# Patient Record
Sex: Male | Born: 1985 | Hispanic: No | Marital: Married | Smoking: Current every day smoker
Health system: Southern US, Community
[De-identification: ages and names within clinical notes are randomized; demographics above are authoritative.]

## PROBLEM LIST (undated history)

## (undated) DIAGNOSIS — J039 Acute tonsillitis, unspecified: Secondary | ICD-10-CM

## (undated) HISTORY — PX: TONSILLECTOMY: SUR1361

---

## 2014-11-24 ENCOUNTER — Ambulatory Visit (INDEPENDENT_AMBULATORY_CARE_PROVIDER_SITE_OTHER): Payer: Medicaid Other | Admitting: Family Medicine

## 2014-11-24 ENCOUNTER — Encounter: Payer: Self-pay | Admitting: Family Medicine

## 2014-11-24 VITALS — BP 105/75 | HR 94 | Temp 98.4°F | Ht 69.5 in | Wt 154.2 lb

## 2014-11-24 DIAGNOSIS — Z603 Acculturation difficulty: Secondary | ICD-10-CM | POA: Diagnosis not present

## 2014-11-24 DIAGNOSIS — Z0289 Encounter for other administrative examinations: Secondary | ICD-10-CM

## 2014-11-24 DIAGNOSIS — Z720 Tobacco use: Secondary | ICD-10-CM

## 2014-11-24 DIAGNOSIS — Z008 Encounter for other general examination: Secondary | ICD-10-CM

## 2014-11-27 DIAGNOSIS — Z720 Tobacco use: Secondary | ICD-10-CM | POA: Insufficient documentation

## 2014-11-27 DIAGNOSIS — Z0289 Encounter for other administrative examinations: Secondary | ICD-10-CM | POA: Insufficient documentation

## 2014-11-27 DIAGNOSIS — Z603 Acculturation difficulty: Secondary | ICD-10-CM | POA: Insufficient documentation

## 2014-11-27 DIAGNOSIS — Z0000 Encounter for general adult medical examination without abnormal findings: Secondary | ICD-10-CM | POA: Insufficient documentation

## 2014-11-27 NOTE — Progress Notes (Signed)
Syrian in-person interpreter utilized during today's visit.  [facility] New Patient Visit  HPI:  Patient presents to [facility] today for a new patient appointment to establish general primary care.  .  ROS: See HPI  Immigrant Social History: - Date arrived in [place]: October 24 2014 - Country of origin: [place] - Location of refugee camp (if applicable), how long there, and what caused patient to leave home country?: [place] - Primary language: Arabic  -Requires intepreter (essentially speaks no [place]) - Education: Highest level of education:  - Tobacco/alcohol/drug use: daily smoking, ~1 ppd - Marriage Status: married, wife here in [place]. - Sexual activity: vaginal w/ wife - Were you beaten or tortured in your country or refugee camp?  denies  - if yes:  Are you having bad dreams about your experience?     Do you feel "jumpy" or "nervous?"     Do you feel that the experience is happening again?     Are you "super alert" or watchful?   Preventative Care History: -Seen at health department?: no -- upcoming appointment -  Past Medical Hx:  -denies  Past Surgical Hx:  -denies  PHYSICAL EXAM: BP 105/75 mmHg  Pulse 94  Temp(Src) 98.4 F (36.9 C) (Oral)  Ht 5' 9.5" (1.765 m)  Wt 154 lb 3.2 oz (69.945 kg)  BMI 22.45 kg/m2 Gen: Well NAD HEENT: EOMI,  MMM Lungs: CTABL Nl WOB Heart: RRR no MRG Abd: NABS, NT, ND Exts: Non edematous BL  LE, warm and well perfused.  Neuro:  No focal deficits Psych:  Not depressed or anxious appearing.  Linear and coherent thought process as evidenced by speech pattern. Smiles spontaneously.

## 2014-11-27 NOTE — Assessment & Plan Note (Signed)
Will need to address next visit.

## 2014-11-27 NOTE — Assessment & Plan Note (Signed)
Negative exam today.  Will need to obtain refugee labs from [facility] dept.

## 2014-11-27 NOTE — Assessment & Plan Note (Signed)
Speaks Arabic.  Needs interpreter.

## 2014-12-04 ENCOUNTER — Encounter: Payer: Self-pay | Admitting: Family Medicine

## 2014-12-04 ENCOUNTER — Ambulatory Visit (INDEPENDENT_AMBULATORY_CARE_PROVIDER_SITE_OTHER): Payer: Medicaid Other | Admitting: Family Medicine

## 2014-12-04 VITALS — BP 111/77 | HR 117 | Temp 100.8°F | Wt 153.0 lb

## 2014-12-04 DIAGNOSIS — J039 Acute tonsillitis, unspecified: Secondary | ICD-10-CM | POA: Diagnosis present

## 2014-12-04 MED ORDER — PENICILLIN V POTASSIUM 250 MG PO TABS: 250.0000 mg | ORAL_TABLET | Freq: Four times a day (QID) | ORAL | Status: AC

## 2014-12-04 MED ORDER — IBUPROFEN 600 MG PO TABS
600.0000 mg | ORAL_TABLET | Freq: Three times a day (TID) | ORAL | Status: DC | PRN
Start: 2014-12-04 — End: 2014-12-04

## 2014-12-04 MED ORDER — PENICILLIN V POTASSIUM 250 MG PO TABS: 250.0000 mg | ORAL_TABLET | Freq: Four times a day (QID) | ORAL | Status: DC

## 2014-12-04 MED ORDER — DIPHENHYDRAMINE HCL 25 MG PO TABS: 25.0000 mg | ORAL_TABLET | Freq: Four times a day (QID) | ORAL | Status: DC | PRN

## 2014-12-04 MED ORDER — IBUPROFEN 600 MG PO TABS
600.0000 mg | ORAL_TABLET | Freq: Three times a day (TID) | ORAL | Status: DC | PRN
Start: 2014-12-04 — End: 2015-06-18

## 2014-12-04 MED ORDER — DIPHENHYDRAMINE HCL 25 MG PO TABS
25.0000 mg | ORAL_TABLET | Freq: Four times a day (QID) | ORAL | Status: DC | PRN
Start: 2014-12-04 — End: 2014-12-04

## 2014-12-04 NOTE — Patient Instructions (Signed)

## 2014-12-07 NOTE — Progress Notes (Signed)
Patient ID: Jerome Parker, male   DOB: 1985-12-30, 29 y.o.   MRN: [id number]   [facility] Yolande Jollyaleb G Kely Dohn, MD Phone: [phone]  Subjective:   # Throat Pain - Pt. Presents for SDA for throat pain. He has been having symptoms that started about 4 days ago with some slight nasal discharge / post nasal drip. However, he says that his tonsils have been infected before. He says that his symptoms have gotten rapidly worse to the point that he is experiencing total body chills / tactile fevers. He has total body aches. He has tried ibuprofen with little relief. He is around small children but no known sick contacts.   SORE THROAT  Sore throat began 4 days ago. Pain is: 10/10 Severity: Pt. Reports severe pain mostly with coughing. He has had this before with tonsilitis Medications tried: Ibuprofen, Augmentin (given to him by a physician in [place]) Strep throat exposure: None that he is aware of.  STD exposure: None that he is aware of.   Symptoms Fever: Tactile fevers at home and 100.8 here Cough: Ongoing dry cough Runny nose: Yes, with post nasal drip while laying down.  Muscle aches: Yes Swollen Glands: None that he reports.  Trouble breathing: No  Drooling: No Weight loss: No weight loss.   Patient believes could be caused by: Another episode of tonsillitis. He says that he gets recurrent episodes and has had them prior to coming to the [place] in [place].   Review of Symptoms - see HPI PMH - Smoking status noted.      All relevant systems were reviewed and were negative unless otherwise noted in the HPI  Past Medical History Reviewed problem list.  Medications- reviewed and updated Current Outpatient Prescriptions  Medication Sig Dispense Refill  . diphenhydrAMINE (BENADRYL) 25 MG tablet Take 1 tablet (25 mg total) by mouth every 6 (six) hours as needed. 30 tablet 0  . ibuprofen (ADVIL,MOTRIN) 600 MG tablet Take 1 tablet (600 mg total) by mouth  every 8 (eight) hours as needed. 30 tablet 2  . penicillin v potassium (VEETID) 250 MG tablet Take 1 tablet (250 mg total) by mouth 4 (four) times daily. 40 tablet 0   No current facility-administered medications for this visit.   Chief complaint-noted No additions to family history Social history- patient is a non  smoker  Objective: BP 111/77 mmHg  Pulse 117  Temp(Src) 100.8 F (38.2 C) (Oral)  Wt 153 lb (69.4 kg) Gen: NAD, alert, cooperative with exam HEENT: NCAT, EOMI, PERRL, TMs nml without effusions / erythema, Nares patent without discharge, Throat diffusely erythematous (bright red) with exudates and pustules noted predominantly on left tonsilar area. Pt. Able to fully open mouth.  Neck: FROM, supple. Submandibular LAD palpable.  CV: RRR, good S1/S2, no murmur Resp: CTABL, no wheezes, non-labored Abd: SNTND, BS present, no guarding or organomegaly Ext: No edema, warm, normal tone, moves UE/LE spontaneously Neuro: Alert and oriented, No gross deficits Skin: no rashes no lesions  Assessment/Plan:  # Tonsilitis  - pt. With history of recurrent tonsillitis. No known sick contacts or exposure to CMV, EBV vs. Bacterial etiology.  - Symptomatic treatment with Ibuprofen for pain - Discussed hydration, drinking plenty of water.  - Penicillin V K prescribed for Strep.  - Return precautions given.  - If this is a recurrent problem for this patient, may need to consider surgical intervention. I expect that his symptoms will improve with the above course for  Now.

## 2014-12-31 ENCOUNTER — Encounter (HOSPITAL_COMMUNITY): Payer: Self-pay | Admitting: *Deleted

## 2014-12-31 ENCOUNTER — Emergency Department (INDEPENDENT_AMBULATORY_CARE_PROVIDER_SITE_OTHER)
Admission: EM | Admit: 2014-12-31 | Discharge: 2014-12-31 | Disposition: A | Discharge status: Home / Self Care | Payer: Medicaid Other | Source: Home / Self Care | Attending: Emergency Medicine | Admitting: Emergency Medicine

## 2014-12-31 DIAGNOSIS — Z603 Acculturation difficulty: Secondary | ICD-10-CM | POA: Diagnosis not present

## 2014-12-31 DIAGNOSIS — J312 Chronic pharyngitis: Secondary | ICD-10-CM | POA: Diagnosis not present

## 2014-12-31 HISTORY — DX: Acute tonsillitis, unspecified: J03.90

## 2014-12-31 MED ORDER — CLINDAMYCIN HCL 150 MG PO CAPS: 150.0000 mg | ORAL_CAPSULE | Freq: Three times a day (TID) | ORAL | Status: DC

## 2014-12-31 MED ORDER — CETIRIZINE HCL 10 MG PO TABS: 10.0000 mg | ORAL_TABLET | Freq: Every day | ORAL | Status: DC

## 2014-12-31 MED ORDER — OMEPRAZOLE 20 MG PO CPDR: 20.0000 mg | DELAYED_RELEASE_CAPSULE | Freq: Every day | ORAL | Status: DC

## 2014-12-31 MED ORDER — LIDOCAINE VISCOUS 2 % MT SOLN: 5.0000 mL | OROMUCOSAL | Status: DC | PRN

## 2014-12-31 NOTE — Discharge Instructions (Signed)
I have given you several medicines to help with her sore throat. There is an antibiotic, clindamycin, that you take 3 times a day for 1 week. Take Zyrtec 1 pill daily in case of any nasal drainage. Take omeprazole 1 pill daily in case stomach acid is irritating your tonsils overnight. Use the viscous lidocaine every 4 hours as needed for mouth pain. I have put in a referral to the ear nose and throat doctor to discuss tonsil removal.  You may need to see Dr. Gwendolyn Grant before you can see the ear nose and throat doctor. Follow-up as needed.

## 2014-12-31 NOTE — ED Provider Notes (Signed)
CSN: [id number]     Arrival date & time 12/31/14  1314 History   First MD Initiated Contact with Patient 12/31/14 1325     Chief Complaint  Patient presents with  . Sore Throat   (Consider location/radiation/quality/duration/timing/severity/associated sxs/prior Treatment) HPI  He is a 29 year old man here for evaluation of sore throat. He is a recent immigrant via the refugee system. He is [place] and Arabic only speaking. A phone interpreter was used during this visit.  He states he has had a sore throat since he came to [place], approximately 2 months ago. He was seen at his PCPs office for this 1 month ago and given a course of penicillin. He states this did not help his sore throat. He reports subjective fevers. No nasal discharge, nasal congestion, cough, chest pain, shortness of breath. No nausea or vomiting. He states the sore throat is worse first thing in the morning. He states he had a similar episode a year or so ago when he was at the refugee camp in [place]. He also reports intermittent episodes of mouth ulcers. They are quite painful when they occur, but heal spontaneously in about one week. He would like to see a doctor about getting his tonsils removed.  Past Medical History  Diagnosis Date  . Tonsillitis    History reviewed. No pertinent past surgical history. No family history on file. History  Substance Use Topics  . Smoking status: Current Every Day Smoker  . Smokeless tobacco: Not on file  . Alcohol Use: Not on file    Review of Systems As in history of present illness Allergies  Review of patient's allergies indicates no known allergies.  Home Medications   Prior to Admission medications   Medication Sig Start Date End Date Taking? Authorizing Provider  ibuprofen (ADVIL,MOTRIN) 600 MG tablet Take 1 tablet (600 mg total) by mouth every 8 (eight) hours as needed. 12/04/14  Yes Yolande Jolly, MD  cetirizine (ZYRTEC) 10 MG tablet Take 1 tablet (10 mg total) by  mouth daily. 12/31/14   Charm Rings, MD  clindamycin (CLEOCIN) 150 MG capsule Take 1 capsule (150 mg total) by mouth 3 (three) times daily. 12/31/14   Charm Rings, MD  lidocaine (XYLOCAINE) 2 % solution Use as directed 5 mLs in the mouth or throat every 4 (four) hours as needed for mouth pain. Swish for 15 seconds, then spit. 12/31/14   Charm Rings, MD  omeprazole (PRILOSEC) 20 MG capsule Take 1 capsule (20 mg total) by mouth daily. 12/31/14   Charm Rings, MD   BP 95/56 mmHg  Pulse 68  Temp(Src) 99 F (37.2 C) (Oral)  Resp 16  SpO2 99% Physical Exam  Constitutional: He is oriented to person, place, and time. He appears well-developed and well-nourished. No distress.  HENT:  Right Ear: Tympanic membrane normal.  Left Ear: Tympanic membrane normal.  Nose: Nose normal.  Mouth/Throat: No oropharyngeal exudate.  He has erythematous and cryptic tonsils. No exudate. Mild posterior pharyngeal erythema without cobblestoning.  Eyes: Conjunctivae are normal.  Neck: Neck supple.  Cardiovascular: Normal rate and regular rhythm.   No murmur heard. Pulmonary/Chest: Effort normal and breath sounds normal. No respiratory distress. He has no wheezes. He has no rales.  Lymphadenopathy:    He has no cervical adenopathy.  Neurological: He is alert and oriented to person, place, and time.    ED Course  Procedures (including critical care time) Labs Review Labs Reviewed - No data to  display  Imaging Review No results found.   MDM   1. Sore throat, chronic    We'll treat with a course of clindamycin to cover atypical bacteria. I suspect his sore throat is more a consequence of postnasal drainage or possibly reflux. We'll treat with Zyrtec and omeprazole as well as the antibiotics. I provided a prescription for viscous lidocaine to use as needed for mouth ulcers. I put in a referral to ENT to discuss tonsillectomy. Discussed with the patient that he may need to see his primary care physician  before Medicaid will allow this referral. Follow-up with PCP as needed.    Charm Rings, MD 12/31/14 1430

## 2014-12-31 NOTE — ED Notes (Signed)
[facility]ia [facility] [phone]: c/o sore throat x several weeks; completed 9 days of 10-day course of PCN VK starting 5/16 - states no improvement with abx.  Feels like when he's had tonsillitis in past.  Has felt feverish.  Denies cough, runny nose, or congestion.  C/O bilat earache.  Has been taking 600mg  IBU.

## 2015-01-21 ENCOUNTER — Emergency Department (INDEPENDENT_AMBULATORY_CARE_PROVIDER_SITE_OTHER)
Admission: EM | Admit: 2015-01-21 | Discharge: 2015-01-21 | Disposition: A | Discharge status: Home / Self Care | Payer: Medicaid Other | Source: Home / Self Care

## 2015-01-21 ENCOUNTER — Encounter (HOSPITAL_COMMUNITY): Payer: Self-pay | Admitting: *Deleted

## 2015-01-21 DIAGNOSIS — R5383 Other fatigue: Secondary | ICD-10-CM | POA: Diagnosis not present

## 2015-01-21 DIAGNOSIS — R52 Pain, unspecified: Secondary | ICD-10-CM | POA: Diagnosis not present

## 2015-01-21 DIAGNOSIS — R6883 Chills (without fever): Secondary | ICD-10-CM

## 2015-01-21 DIAGNOSIS — J029 Acute pharyngitis, unspecified: Secondary | ICD-10-CM | POA: Diagnosis not present

## 2015-01-21 LAB — COMPREHENSIVE METABOLIC PANEL
ALT: 12 U/L — ABNORMAL LOW (ref 17–63)
ANION GAP: 9 (ref 5–15)
AST: 16 U/L (ref 15–41)
Albumin: 4.4 g/dL (ref 3.5–5.0)
Alkaline Phosphatase: 59 U/L (ref 38–126)
BILIRUBIN TOTAL: 0.6 mg/dL (ref 0.3–1.2)
BUN: 10 mg/dL (ref 6–20)
CHLORIDE: 101 mmol/L (ref 101–111)
CO2: 26 mmol/L (ref 22–32)
Calcium: 9.1 mg/dL (ref 8.9–10.3)
Creatinine, Ser: 0.87 mg/dL (ref 0.61–1.24)
GFR calc Af Amer: >60 mL/min (ref >60)
GFR calc non Af Amer: >60 mL/min (ref >60)
GLUCOSE: 97 mg/dL (ref 65–99)
Potassium: 3.6 mmol/L (ref 3.5–5.1)
Sodium: 136 mmol/L (ref 135–145)
Total Protein: 7.5 g/dL (ref 6.5–8.1)

## 2015-01-21 LAB — POCT INFECTIOUS MONO SCREEN: Mono Screen: NEGATIVE

## 2015-01-21 LAB — CBC
HCT: 39.4 % (ref 39.0–52.0)
Hemoglobin: 13.2 g/dL (ref 13.0–17.0)
MCH: 29.4 pg (ref 26.0–34.0)
MCHC: 33.5 g/dL (ref 30.0–36.0)
MCV: 87.8 fL (ref 78.0–100.0)
Platelets: 252 10*3/uL (ref 150–400)
RBC: 4.49 MIL/uL (ref 4.22–5.81)
RDW: 14.7 % (ref 11.5–15.5)
WBC: 13.1 10*3/uL — AB (ref 4.0–10.5)

## 2015-01-21 LAB — POCT RAPID STREP A: Streptococcus, Group A Screen (Direct): NEGATIVE

## 2015-01-21 MED ORDER — AZITHROMYCIN 250 MG PO TABS: ORAL_TABLET | ORAL | Status: DC

## 2015-01-21 MED ORDER — CEFTRIAXONE SODIUM 1 G IJ SOLR
INTRAMUSCULAR | Status: AC
  Filled 2015-01-21: qty 10

## 2015-01-21 MED ORDER — CEFTRIAXONE SODIUM 1 G IJ SOLR
1.0000 g | Freq: Once | INTRAMUSCULAR | Status: AC
  Administered 2015-01-21: 1 g via INTRAMUSCULAR

## 2015-01-21 NOTE — Discharge Instructions (Signed)
Your strep swab was negative.  We sent the swab for culture which should take a few days. We are checking you for some other things today and these labs will be back in a few days.  We gave you an antibiotic shot today. We are also sending in an oral antibiotic, please take 2 pills tomorrow then one pill a day for the next 4 days.  Please take prilosec daily for possible reflux.  You should follow up with Dr Gwendolyn GrantWalden for further work up if all these tests are negative. One cause of this could be a cyclical fever syndrome.

## 2015-01-21 NOTE — ED Notes (Addendum)
Pt reports     sorethroat  reoccuring  Was  Seen   ucc   3   Weeks  Ago  For chronic  Throat  Infections   Pt  Has  Been  Feeling  Weak  Feverish  And  Tired

## 2015-01-21 NOTE — ED Provider Notes (Signed)
CSN: [id number]     Arrival date & time 01/21/15  1428 History   None    Chief Complaint  Patient presents with  . Sore Throat    HPI  28 yom [place] from [place] sore throat.   Interview conducted through phone interpretor.   Was seen at our clinic 3 wks for st. Started on clinda for possible atypical coverage. Pt instructed to start prilosec for possible lpr and zyrtec for congestion/post nasal drip. Instructed to f/u with pcp.   Today he states st resolved for couple wks after seeing [place] but returned one wk ago. He finished clinda but never took prilosec/zyrtec.   Today he states severely st past 4-5 days. Hurts to swallow. Subjective fevers. Fatigue. Chills. Generalized body aches past few days. He states this st has been recurrent for past year. He generally gets every month for approx one week. This has been occuring since he entered the [place] from [place].   Denies head congestion, nasal congestion, itchy/watery eyes, reflux sx. Denies trouble handling secretions, drooling, voice change.   Past Medical History  Diagnosis Date  . Tonsillitis    History reviewed. No pertinent past surgical history. History reviewed. No pertinent family history. History  Substance Use Topics  . Smoking status: Current Every Day Smoker  . Smokeless tobacco: Not on file  . Alcohol Use: Not on file    Review of Systems See HPI.  Allergies  Review of patient's allergies indicates no known allergies.  Home Medications   Prior to Admission medications   Medication Sig Start Date End Date Taking? Authorizing Provider  azithromycin (ZITHROMAX) 250 MG tablet Take 2 tabs PO x 1 dose, then 1 tab PO QD x 4 days 01/21/15   Raelyn Ensign, PA  cetirizine (ZYRTEC) 10 MG tablet Take 1 tablet (10 mg total) by mouth daily. 12/31/14   Charm Rings, MD  clindamycin (CLEOCIN) 150 MG capsule Take 1 capsule (150 mg total) by mouth 3 (three) times daily. 12/31/14   Charm Rings, MD  ibuprofen  (ADVIL,MOTRIN) 600 MG tablet Take 1 tablet (600 mg total) by mouth every 8 (eight) hours as needed. 12/04/14   Hillery Hunter Melancon, MD  lidocaine (XYLOCAINE) 2 % solution Use as directed 5 mLs in the mouth or throat every 4 (four) hours as needed for mouth pain. Swish for 15 seconds, then spit. 12/31/14   Charm Rings, MD  omeprazole (PRILOSEC) 20 MG capsule Take 1 capsule (20 mg total) by mouth daily. 12/31/14   Charm Rings, MD   BP 108/68 mmHg  Pulse 80  Temp(Src) 99.1 F (37.3 C) (Oral)  Resp 18  SpO2 100% Physical Exam  Constitutional: He is oriented to person, place, and time. He appears well-developed and well-nourished.  Non-toxic appearance. He does not have a sickly appearance. He does not appear ill. No distress.  HENT:  Right Ear: Tympanic membrane normal.  Left Ear: Tympanic membrane normal.  Nose: No mucosal edema or rhinorrhea. Right sinus exhibits no maxillary sinus tenderness and no frontal sinus tenderness. Left sinus exhibits no maxillary sinus tenderness and no frontal sinus tenderness.  Mouth/Throat: Uvula is midline and mucous membranes are normal. No dental abscesses or uvula swelling. Oropharyngeal exudate and posterior oropharyngeal erythema present. No posterior oropharyngeal edema or tonsillar abscesses.  Erythematous posterior pharynx with exudates on bilateral tonsils. No tonsillar enlargement. No uvular deviation. No "hot potato" voice.   Neck: No Brudzinski's sign noted.  Pulmonary/Chest: Effort  normal and breath sounds normal. No tachypnea.  Lymphadenopathy:       Head (right side): No submental, no submandibular and no tonsillar adenopathy present.       Head (left side): No submental, no submandibular and no tonsillar adenopathy present.    He has no cervical adenopathy.  Neurological: He is alert and oriented to person, place, and time.   ED Course  Procedures (including critical care time) Labs Review  Strep swab negative.   Imaging Review No results  found.   MDM   1. Sore throat   2. Body aches   3. Other fatigue   4. Chills    Recurrent sore throats. Pain/abnormal exam has returned despite finishing clindamycin course last week. Strep sawb neg today. Throat culture sent. Checking for mono, hiv, cbc, cmp today. Rocephin today due to abnormal exam and recurrent pain. Start azithro. Reiterated starting prilosec for possible lpr. F/U with Dr Gwendolyn GrantWalden. Cyclical fever syndrome possible etiology. Pt expressed understanding to f/u with Dr Gwendolyn GrantWalden through interpretor.     Raelyn Ensignodd Deyton Ellenbecker, [place] 01/22/15 2140

## 2015-01-21 NOTE — ED Notes (Signed)
[employer]  Interpretors  Used   

## 2015-01-22 LAB — HIV ANTIBODY (ROUTINE TESTING W REFLEX): HIV SCREEN 4TH GENERATION: NONREACTIVE

## 2015-01-22 NOTE — ED Notes (Signed)
Final reports all negative, no further action required

## 2015-01-24 ENCOUNTER — Ambulatory Visit: Payer: Medicaid Other | Admitting: Family Medicine

## 2015-01-24 LAB — CULTURE, GROUP A STREP: Strep A Culture: NEGATIVE

## 2015-01-26 ENCOUNTER — Encounter: Payer: Self-pay | Admitting: Internal Medicine

## 2015-01-26 ENCOUNTER — Ambulatory Visit (INDEPENDENT_AMBULATORY_CARE_PROVIDER_SITE_OTHER): Payer: Medicaid Other | Admitting: Internal Medicine

## 2015-01-26 VITALS — BP 92/72 | HR 85 | Temp 97.6°F | Ht 69.5 in | Wt 144.3 lb

## 2015-01-26 DIAGNOSIS — J3501 Chronic tonsillitis: Secondary | ICD-10-CM | POA: Diagnosis present

## 2015-01-26 DIAGNOSIS — J029 Acute pharyngitis, unspecified: Secondary | ICD-10-CM | POA: Insufficient documentation

## 2015-01-26 MED ORDER — FLUTICASONE PROPIONATE 50 MCG/ACT NA SUSP
2.0000 | Freq: Every day | NASAL | Status: DC
Start: 2015-01-26 — End: 2017-01-19

## 2015-01-26 NOTE — Patient Instructions (Signed)
I have put in the referral to ENT.   Continue using zyrtec and omeprazole.   Fill Rx for flonase.   Nice to meet you today!

## 2015-01-26 NOTE — Assessment & Plan Note (Signed)
Has had recent negative mono, strep, and HIV tests. Recently completed course of azithromycin. Due to recurrent sore throats and abnormalities to his tonsils, am referring to an ENT. Have given Rx for flonase and encouraged pt to continue taking zyrtec to treat for possible allergic rhinitis and irritation.

## 2015-01-26 NOTE — Progress Notes (Addendum)
   Subjective:    Patient ID: Jerome Parker, male    DOB: Sep 17, 1985, 29 y.o.   MRN: [id number]  HPI Chronic throat pain for over a year. Pain occurs 3-4x per month and lasts for a few days each time. Associated symptoms include body aches, fever, ear pain, and rhinorrhea. Also reports chronic mouth ulcers. Patient has finished course of azithromycin that he received at recent urgent care visit.    Review of Systems See HPI. All of ROS are negative.     Objective:   Physical Exam  Constitutional: He appears well-developed and well-nourished.  HENT:  Right Ear: There is tenderness. No drainage. Tympanic membrane is not erythematous.  Left Ear: There is tenderness. No drainage. Tympanic membrane is not erythematous.  Nose: Rhinorrhea present. No mucosal edema.  Mouth/Throat: Oral lesions present. Normal dentition. Oropharyngeal exudate and posterior oropharyngeal erythema present.          Assessment & Plan:  29 year old male presenting with chronic sore throat.  If mouth ulcers do not improve with time and possible tonsillectomy, investigate other possible causes.

## 2015-04-24 ENCOUNTER — Encounter (HOSPITAL_COMMUNITY): Payer: Self-pay | Admitting: Emergency Medicine

## 2015-04-24 ENCOUNTER — Emergency Department (HOSPITAL_COMMUNITY)
Admission: EM | Admit: 2015-04-24 | Discharge: 2015-04-24 | Disposition: A | Discharge status: Home / Self Care | Payer: Medicaid Other | Source: Home / Self Care | Attending: Family Medicine | Admitting: Family Medicine

## 2015-04-24 DIAGNOSIS — J029 Acute pharyngitis, unspecified: Secondary | ICD-10-CM

## 2015-04-24 MED ORDER — AMOXICILLIN 250 MG PO CAPS: 250.0000 mg | ORAL_CAPSULE | Freq: Three times a day (TID) | ORAL | Status: DC

## 2015-04-24 NOTE — ED Provider Notes (Signed)
CSN: [id number]     Arrival date & time 04/24/15  1346 History   First MD Initiated Contact with Patient 04/24/15 1558     Chief Complaint  Patient presents with  . Sore Throat   (Consider location/radiation/quality/duration/timing/severity/associated sxs/prior Treatment) Patient is a 29 y.o. male presenting with pharyngitis. The history is provided by the patient. A language interpreter was used (Arabic interpreter telephone).  Sore Throat Pertinent negatives include no chest pain, no abdominal pain and no shortness of breath.  Arabic language interpreter used.  Patient presents to [facility] for complaint of recurrent acute pharyngitis that recurred in the past 2-3 days, accompanied by chills yesterday. No fevers documented.  Has been seen [facility] in [facility] as well as [facility] for this. Primary doctor is in [facility].  Patient reports he has seen ENT and was told that if six recurrences in one year, will be recommended for tonsillectomy.   Rates the pain 10/10. Denies cough or ear pain, no rhinorrhea. No N/V, does get some GERD symptoms with spicy foods.  No diarrhoea or dysuria.   Past Medical History  Diagnosis Date  . Tonsillitis    History reviewed. No pertinent past surgical history. No family history on file. Social History  Substance Use Topics  . Smoking status: Current Every Day Smoker  . Smokeless tobacco: None  . Alcohol Use: No    Review of Systems  Constitutional: Positive for chills. Negative for fever and fatigue.  HENT: Positive for sore throat. Negative for congestion, ear discharge, ear pain, postnasal drip, rhinorrhea and sinus pressure.   Respiratory: Negative for cough, chest tightness, shortness of breath and wheezing.   Cardiovascular: Negative for chest pain.  Gastrointestinal: Negative for nausea, abdominal pain, diarrhea and constipation.    Allergies  Review of patient's allergies indicates no known allergies.  Home Medications   Prior to Admission medications    Medication Sig Start Date End Date Taking? Authorizing Provider  amoxicillin (AMOXIL) 250 MG capsule Take 1 capsule (250 mg total) by mouth 3 (three) times daily. 04/24/15   Barbaraann Barthel, MD  azithromycin (ZITHROMAX) 250 MG tablet Take 2 tabs PO x 1 dose, then 1 tab PO QD x 4 days Patient not taking: Reported on 04/24/2015 01/21/15   Raelyn Ensign, PA  cetirizine (ZYRTEC) 10 MG tablet Take 1 tablet (10 mg total) by mouth daily. 12/31/14   Charm Rings, MD  clindamycin (CLEOCIN) 150 MG capsule Take 1 capsule (150 mg total) by mouth 3 (three) times daily. Patient not taking: Reported on 04/24/2015 12/31/14   Charm Rings, MD  fluticasone [facility]) 50 MCG/ACT nasal spray Place 2 sprays into both nostrils daily. 01/26/15   Arvilla Market, DO  ibuprofen (ADVIL,MOTRIN) 600 MG tablet Take 1 tablet (600 mg total) by mouth every 8 (eight) hours as needed. 12/04/14   Hillery Hunter Melancon, MD  lidocaine (XYLOCAINE) 2 % solution Use as directed 5 mLs in the mouth or throat every 4 (four) hours as needed for mouth pain. Swish for 15 seconds, then spit. 12/31/14   Charm Rings, MD  omeprazole (PRILOSEC) 20 MG capsule Take 1 capsule (20 mg total) by mouth daily. 12/31/14   Charm Rings, MD   Meds Ordered and Administered this Visit  Medications - No data to display  BP 98/51 mmHg  Pulse 68  Temp(Src) 98.5 F (36.9 C) (Oral)  Resp 14  SpO2 99% No data found.   Physical Exam  Constitutional: He appears well-developed and well-nourished.  No distress.  HENT:  Head: Normocephalic and atraumatic.  Erythema of oropharynx; scant tonsillar exudates.  Moist mucus membranes. Shotty anterior cervical adenopathy. Neck supple, no limitations to ROM actively.   Pulmonary/Chest: He has no wheezes.  Skin: He is not diaphoretic.    ED Course  Procedures (including critical care time)  Labs Review Labs Reviewed - No data to display  Imaging Review No results found.   Visual Acuity Review  Right Eye Distance:    Left Eye Distance:   Bilateral Distance:    Right Eye Near:   Left Eye Near:    Bilateral Near:         MDM   1. Acute pharyngitis, unspecified etiology        Barbaraann Barthel, MD 04/24/15 [phone]

## 2015-04-24 NOTE — Discharge Instructions (Signed)
It was a pleasure to see you today.   For your recurrent throat infection, I am prescribing AMOXICILLIN  capsules, take 1 capsule by mouth three times a day for 10 days.   Please make an appointment with your primary doctor, Dr. Gwendolyn Grant, in the coming 2 weeks.

## 2015-04-24 NOTE — ED Notes (Signed)
Sore throat and has been evaluated for previous throat illness at pcp and by ent.  Denies cough.  Current sore throat is worse than last sore throat.  Intermittent chills

## 2015-05-15 ENCOUNTER — Ambulatory Visit: Payer: Medicaid Other | Admitting: Family Medicine

## 2015-06-18 ENCOUNTER — Emergency Department (HOSPITAL_COMMUNITY)
Admission: EM | Admit: 2015-06-18 | Discharge: 2015-06-18 | Disposition: A | Discharge status: Home / Self Care | Payer: Medicaid Other | Attending: Emergency Medicine | Admitting: Emergency Medicine

## 2015-06-18 DIAGNOSIS — J020 Streptococcal pharyngitis: Secondary | ICD-10-CM | POA: Diagnosis not present

## 2015-06-18 DIAGNOSIS — F17200 Nicotine dependence, unspecified, uncomplicated: Secondary | ICD-10-CM | POA: Insufficient documentation

## 2015-06-18 DIAGNOSIS — J029 Acute pharyngitis, unspecified: Secondary | ICD-10-CM | POA: Diagnosis present

## 2015-06-18 DIAGNOSIS — Z79899 Other long term (current) drug therapy: Secondary | ICD-10-CM | POA: Insufficient documentation

## 2015-06-18 LAB — RAPID STREP SCREEN (MED CTR MEBANE ONLY): STREPTOCOCCUS, GROUP A SCREEN (DIRECT): POSITIVE — AB

## 2015-06-18 MED ORDER — OXYCODONE-ACETAMINOPHEN 5-325 MG PO TABS
1.0000 | ORAL_TABLET | Freq: Once | ORAL | Status: AC
  Administered 2015-06-18: 1 via ORAL
  Filled 2015-06-18: qty 1

## 2015-06-18 MED ORDER — PENICILLIN G BENZATHINE 1200000 UNIT/2ML IM SUSP
1.2000 10*6.[IU] | Freq: Once | INTRAMUSCULAR | Status: AC
  Administered 2015-06-18: 1.2 10*6.[IU] via INTRAMUSCULAR
  Filled 2015-06-18: qty 2

## 2015-06-18 MED ORDER — HYDROCODONE-ACETAMINOPHEN 5-325 MG PO TABS: 1.0000 | ORAL_TABLET | Freq: Four times a day (QID) | ORAL | Status: DC | PRN

## 2015-06-18 MED ORDER — DEXAMETHASONE SODIUM PHOSPHATE 10 MG/ML IJ SOLN
10.0000 mg | Freq: Once | INTRAMUSCULAR | Status: AC
  Administered 2015-06-18: 10 mg via INTRAMUSCULAR
  Filled 2015-06-18: qty 1

## 2015-06-18 NOTE — ED Provider Notes (Signed)
CSN: [id number]     Arrival date & time 06/18/15  1948 History  By signing my name below, I, Jarvis Morganaylor Ferguson, attest that this documentation has been prepared under the direction and in the presence of Kerrie BuffaloHope Georjean Toya, NP Electronically Signed: Jarvis Morganaylor Ferguson, ED Scribe. 06/19/2015. 2:55 AM.   No chief complaint on file.   The history is provided by the patient and the spouse. A language interpreter was used (Arabic).    HPI Comments: Jerome Parker is a 29 y.o. male who presents to the Emergency Department complaining of intermittent, moderate, sore throat onset 3 years. Pt states he typically gets a sore throat several times per month. He endorses associated subjective fever and difficulty swallowing. Pt reports he has seen a doctor in [place] and has followed up with 2 physicians [place] the [place]. He states Dr. Pollyann Kennedyosen told him he needs to have surgery but they are wanting to wait 6 months to see if he keeps getting recurrent strep infections; it has currently been 4 months so far and his next appt is 2 days from now. Pt notes that Dr. Pollyann Kennedyosen gave him acylovir. He would like to be referred to a new ENT and someone who would do surgery immediately. He states the pain is exacerbated with swallowing. He denies any alleviating factors. Pt took Ibuprofen prior to arrival with no significant relief. He is a current every day smoker. Pt denies any other associated symptoms at this time.    Past Medical History  Diagnosis Date  . Tonsillitis    No past surgical history on file. No family history on file. Social History  Substance Use Topics  . Smoking status: Current Every Day Smoker  . Smokeless tobacco: Not on file  . Alcohol Use: No    Review of Systems  Constitutional: Positive for fever.  HENT: Positive for sore throat.        Pain with swallowing  All other systems reviewed and are negative.     Allergies  Review of patient's allergies indicates no known allergies.  Home Medications    Prior to Admission medications   Medication Sig Start Date End Date Taking? Authorizing Provider  ibuprofen (ADVIL,MOTRIN) 800 MG tablet Take 800 mg by mouth every 8 (eight) hours as needed for moderate pain.   Yes Historical Provider, MD  pseudoephedrine (SUDAFED) 120 MG 12 hr tablet Take 120 mg by mouth 2 (two) times daily.   Yes Historical Provider, MD  fluticasone (FLONASE) 50 MCG/ACT nasal spray Place 2 sprays into both nostrils daily. Patient not taking: Reported on 06/18/2015 01/26/15   Arvilla Marketatherine Lauren Wallace, DO  HYDROcodone-acetaminophen [facility](NORCO) 5-325 MG tablet Take 1 tablet by mouth every 6 (six) hours as needed. 06/18/15   Adriane Gabbert Orlene OchM Kennedee Kitzmiller, NP  lidocaine (XYLOCAINE) 2 % solution Use as directed 5 mLs in the mouth or throat every 4 (four) hours as needed for mouth pain. Swish for 15 seconds, then spit. Patient not taking: Reported on 06/18/2015 12/31/14   Charm RingsErin J Honig, MD  omeprazole (PRILOSEC) 20 MG capsule Take 1 capsule (20 mg total) by mouth daily. Patient not taking: Reported on 06/18/2015 12/31/14   Charm RingsErin J Honig, MD   Triage Vitals: BP 125/70 mmHg  Pulse 110  Temp(Src) 98.2 F (36.8 C)  Resp 20  SpO2 100%  Physical Exam  Constitutional: He is oriented to person, place, and time. He appears well-developed and well-nourished. No distress.  HENT:  Head: Normocephalic and atraumatic.  Mouth/Throat: Uvula is midline. No  trismus in the jaw. Oropharyngeal exudate and posterior oropharyngeal erythema (left) present. No posterior oropharyngeal edema or tonsillar abscesses.  Eyes: EOM are normal.  Neck: Normal range of motion. Neck supple.  Cardiovascular: Normal rate and regular rhythm.   Pulmonary/Chest: Effort normal. He has no wheezes.  Musculoskeletal: Normal range of motion. He exhibits no edema.  Lymphadenopathy:    He has cervical adenopathy (right).  Neurological: He is alert and oriented to person, place, and time. No cranial nerve deficit.  Skin: Skin is warm and dry.   Psychiatric: He has a normal mood and affect.  Nursing note and vitals reviewed.   ED Course  Procedures (including critical care time)  DIAGNOSTIC STUDIES: Oxygen Saturation is 100% on RA, normal by my interpretation.    COORDINATION OF CARE:  8:56 PM- Will order rapid strep screen. Pt advised of plan for treatment and pt agrees.    Labs Review Labs Reviewed  RAPID STREP SCREEN (NOT AT [facility]) - Abnormal; Notable for the following:    Streptococcus, Group A Screen (Direct) POSITIVE (*)    All other components within normal limits    Imaging Review No results found. I have personally reviewed and evaluated these lab results as part of my medical decision-making.   MDM  29 y.o. male with sore throat and positive strep screen stable for d/c without difficulty swallowing and does not appear toxic. Treated with Bicillin 1.2 MU IM. Discussed with the patient clinical and lab findings and plan of care and all questioned fully answered. He will follow up with ENT.    Final diagnoses:  Strep throat   I personally performed the services described in this documentation, which was scribed in my presence. The recorded information has been reviewed and is accurate.    [address] [place], NP 06/19/15 1610  Leta Baptist, MD 06/19/15 [id number]

## 2015-06-18 NOTE — Discharge Instructions (Signed)
Strep Throat °Strep throat is an infection of the throat. It is caused by germs. Strep throat spreads from person to person because of coughing, sneezing, or close contact. °HOME CARE °Medicines  °· Take over-the-counter and prescription medicines only as told by your doctor. °· Take your antibiotic medicine as told by your doctor. Do not stop taking the medicine even if you feel better. °· Have family members who also have a sore throat or fever go to a doctor. °Eating and Drinking  °· Do not share food, drinking cups, or personal items. °· Try eating soft foods until your sore throat feels better. °· Drink enough fluid to keep your pee (urine) clear or pale yellow. °General Instructions °· Rinse your mouth (gargle) with a salt-water mixture 3-4 times per day or as needed. To make a salt-water mixture, stir ½-1 tsp of salt into 1 cup of warm water. °· Make sure that all people in your house wash their hands well. °· Rest. °· Stay home from school or work until you have been taking antibiotics for 24 hours. °· Keep all follow-up visits as told by your doctor. This is important. °GET HELP IF: °· Your neck keeps getting bigger. °· You get a rash, cough, or earache. °· You cough up thick liquid that is green, yellow-brown, or bloody. °· You have pain that does not get better with medicine. °· Your problems get worse instead of getting better. °· You have a fever. °GET HELP RIGHT AWAY IF: °· You throw up (vomit). °· You get a very bad headache. °· You neck hurts or it feels stiff. °· You have chest pain or you are short of breath. °· You have drooling, very bad throat pain, or changes in your voice. °· Your neck is swollen or the skin gets red and tender. °· Your mouth is dry or you are peeing less than normal. °· You keep feeling more tired or it is hard to wake up. °· Your joints are red or they hurt. °  °This information is not intended to replace advice given to you by your health care provider. Make sure you  discuss any questions you have with your health care provider. °  °Document Released: 12/24/2007 Document Revised: 03/28/2015 Document Reviewed: 10/30/2014 °Elsevier Interactive Patient Education ©2016 [employer] Inc. ° °

## 2015-08-21 ENCOUNTER — Encounter: Payer: Self-pay | Admitting: Family Medicine

## 2015-08-21 ENCOUNTER — Ambulatory Visit: Payer: Medicaid Other | Admitting: Family Medicine

## 2015-08-21 ENCOUNTER — Ambulatory Visit (INDEPENDENT_AMBULATORY_CARE_PROVIDER_SITE_OTHER): Payer: Medicaid Other | Admitting: Family Medicine

## 2015-08-21 VITALS — BP 119/76 | HR 69 | Temp 98.1°F | Wt 161.2 lb

## 2015-08-21 DIAGNOSIS — H5713 Ocular pain, bilateral: Secondary | ICD-10-CM

## 2015-08-21 DIAGNOSIS — H547 Unspecified visual loss: Secondary | ICD-10-CM

## 2015-08-21 DIAGNOSIS — R51 Headache: Secondary | ICD-10-CM | POA: Diagnosis present

## 2015-08-21 DIAGNOSIS — R519 Headache, unspecified: Secondary | ICD-10-CM

## 2015-08-21 MED ORDER — KETOROLAC TROMETHAMINE 60 MG/2ML IM SOLN
60.0000 mg | Freq: Once | INTRAMUSCULAR | Status: AC
  Administered 2015-08-21: 60 mg via INTRAMUSCULAR

## 2015-08-21 NOTE — Patient Instructions (Signed)
I think you have a headache or a migraine. Your exam today was normal. If you continue to have issues with pain please let us know. If you start having severe pain, nausea, vomiting, weakness, numbness, or changes in vision, please let us know.  Take care,  Dr Jimmey Ralph

## 2015-08-21 NOTE — Progress Notes (Signed)
    Subjective:  Jerome Parker is a 30 y.o. male who presents to the [facility] today for same day appointment with a chief complaint of eye pain. Language line was used.   HPI:  Eye Pain Started 4-5 days ago. Pain located above eyes and into forehead. Pain is intermittent in nature and improved with rest. Pain lasts for 3-4 minutes happens 3-4 hours per day. Bright lights make the pain worse. Has not tried any medications. Feels like vision is "whiter." No fevers or chills. No history of migraines or headache. No sick contact. No rhinorrhea, cough, or sneeze. No weakness or numbness. No increased pain with neck flexion or cough. Not worse with movement.   ROS: Per HPI  Objective:  Physical Exam: BP 119/76 mmHg  Pulse 69  Temp(Src) 98.1 F (36.7 C) (Oral)  Wt 161 lb 3.2 oz (73.12 kg)  Gen: NAD, resting comfortably HEENT: PERRL. EOMI. No papilledema noted.  CV: RRR with no murmurs appreciated Pulm: NWOB, CTAB with no crackles, wheezes, or rhonchi MSK: no edema, cyanosis, or clubbing noted Skin: warm, dry Neuro: CN2-12 intact. Strength 5/5 throughout. Sensation to light touch intact throughout.  Psych: Normal affect and thought content  Vision Screening: R: 20/50 L: 20/40 B: 20/40  Assessment/Plan:  Headache No red flag signs or symptoms. Neurological exam intact. Likely new onset tension type headache. Pain improved in office with  of IM toradol. Pain likely related to eye strain from decreased visual acuity.  Decreased Visual Acuity Vision screening results above. Will refer to optometry.   Katina Degree. Jimmey Ralph, MD [facility] Resident PGY-2 08/21/2015 2:49 PM

## 2016-11-20 ENCOUNTER — Ambulatory Visit (INDEPENDENT_AMBULATORY_CARE_PROVIDER_SITE_OTHER): Payer: Medicaid Other | Admitting: Family Medicine

## 2016-11-20 ENCOUNTER — Encounter: Payer: Self-pay | Admitting: Family Medicine

## 2016-11-20 VITALS — BP 110/74 | HR 63 | Temp 97.9°F | Ht 69.5 in | Wt 170.4 lb

## 2016-11-20 DIAGNOSIS — B002 Herpesviral gingivostomatitis and pharyngotonsillitis: Secondary | ICD-10-CM

## 2016-11-20 DIAGNOSIS — J029 Acute pharyngitis, unspecified: Secondary | ICD-10-CM | POA: Diagnosis not present

## 2016-11-20 LAB — POCT RAPID STREP A (OFFICE): Rapid Strep A Screen: NEGATIVE

## 2016-11-20 MED ORDER — VALACYCLOVIR HCL 1 G PO TABS
1000.0000 mg | ORAL_TABLET | Freq: Two times a day (BID) | ORAL | 0 refills | Status: DC
Start: 2016-11-20 — End: 2021-05-16

## 2016-11-20 NOTE — Patient Instructions (Addendum)
Sore throat suspected to be secondary to viral infection (Herpes simplex). Valacyclovir sent to pharmacy to take twice daily for 10 days. Referral to ENT placed.  Dr. Caroleen Hammanumley

## 2016-11-23 NOTE — Progress Notes (Signed)
Subjective:     Patient ID: Jerome Parker, male   DOB: 06-Aug-1985, 31 y.o.   MRN: [id number]  HPI Jerome Parker is a 31yo male presenting today with oral lesions. Visit conducted with aid of Arabic interpretor.  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 Received tonsillectomy in 2016.  Today presenting with history of sore throat 10 days ago which resolved, however now with oral ulcers for the last several days. Similar to ulcers he has had in the past with prior flares of herpes. Presented to an Urgent Care facility and treated with [facility] for possible strep throat, however reports they did not swab him. Ulcers are painful and make it difficult for him to eat, however denies decreased oral intake. When asked about fever, he reports he "always" has fevers for the last several years. Previously followed by Dr. Pollyann Kennedyosen with ENT and requests referral back to their office. Patient insists on cure for herpes multiple times throughout encounter and becomes angry when told there is no cure--discussed that we can treat flares and try to decrease how often they occur, but patient insists on curing herpes all together. Smoker.  Review of Systems Per HPI    Objective:   Physical Exam  Constitutional: He appears well-developed and well-nourished. No distress.  HENT:  Head: Normocephalic and atraumatic.  Mouth/Throat: No oropharyngeal exudate.  History of Tonsillectomy. Three oral ulcers noted along right side of mouth, pale in appearance.   Cardiovascular: Normal rate and regular rhythm.   No murmur heard. Pulmonary/Chest: Effort normal. No respiratory distress. He has no wheezes.  Lymphadenopathy:    He has no cervical adenopathy.  Psychiatric:  Angry throughout encounter      Assessment and Plan:     1. Herpes Pharyngitis Consistent with prior episodes per patient. Did not swab for HSV today given prior diagnosis. Patient refuses Acyclovir  despite prior success and tolerance, wanting to try something different. Prescription for Valacyclovir sent to pharmacy. Referral to ENT placed, previously followed by Dr. Pollyann Kennedyosen. Patient is at increased risk for oral cancer given history of smoking. Note patient was angry throughout encounter due to no cure for HSV and unable to guarantee this will not recur again. Patient requested information on HSV, however refused patient handout in Arabic when nurse attempted to give it to him.

## 2017-01-10 ENCOUNTER — Emergency Department (HOSPITAL_COMMUNITY)
Admission: EM | Admit: 2017-01-10 | Discharge: 2017-01-10 | Discharge status: Against Medical Advice | Payer: Medicaid Other | Attending: Emergency Medicine | Admitting: Emergency Medicine

## 2017-01-10 ENCOUNTER — Encounter (HOSPITAL_COMMUNITY): Payer: Self-pay

## 2017-01-10 DIAGNOSIS — R1084 Generalized abdominal pain: Secondary | ICD-10-CM | POA: Diagnosis not present

## 2017-01-10 DIAGNOSIS — F17200 Nicotine dependence, unspecified, uncomplicated: Secondary | ICD-10-CM | POA: Diagnosis not present

## 2017-01-10 DIAGNOSIS — Z79899 Other long term (current) drug therapy: Secondary | ICD-10-CM | POA: Diagnosis not present

## 2017-01-10 DIAGNOSIS — R1012 Left upper quadrant pain: Secondary | ICD-10-CM | POA: Diagnosis present

## 2017-01-10 LAB — CBC
HEMATOCRIT: 41.6 % (ref 39.0–52.0)
HEMOGLOBIN: 14 g/dL (ref 13.0–17.0)
MCH: 29.9 pg (ref 26.0–34.0)
MCHC: 33.7 g/dL (ref 30.0–36.0)
MCV: 88.9 fL (ref 78.0–100.0)
Platelets: 255 10*3/uL (ref 150–400)
RBC: 4.68 MIL/uL (ref 4.22–5.81)
RDW: 14.1 % (ref 11.5–15.5)
WBC: 12.4 10*3/uL — ABNORMAL HIGH (ref 4.0–10.5)

## 2017-01-10 LAB — URINALYSIS, ROUTINE W REFLEX MICROSCOPIC
BACTERIA UA: NONE SEEN
Bilirubin Urine: NEGATIVE
Glucose, UA: NEGATIVE mg/dL
KETONES UR: NEGATIVE mg/dL
Leukocytes, UA: NEGATIVE
Nitrite: NEGATIVE
PROTEIN: NEGATIVE mg/dL
Specific Gravity, Urine: 1.025 (ref 1.005–1.030)
Squamous Epithelial / LPF: NONE SEEN
pH: 5 (ref 5.0–8.0)

## 2017-01-10 LAB — COMPREHENSIVE METABOLIC PANEL
ALK PHOS: 56 U/L (ref 38–126)
ALT: 13 U/L — ABNORMAL LOW (ref 17–63)
AST: 17 U/L (ref 15–41)
Albumin: 4.2 g/dL (ref 3.5–5.0)
Anion gap: 8 (ref 5–15)
BUN: 15 mg/dL (ref 6–20)
CALCIUM: 9.4 mg/dL (ref 8.9–10.3)
CHLORIDE: 100 mmol/L — AB (ref 101–111)
CO2: 28 mmol/L (ref 22–32)
Creatinine, Ser: 0.99 mg/dL (ref 0.61–1.24)
GFR calc Af Amer: >60 mL/min (ref >60)
GFR calc non Af Amer: >60 mL/min (ref >60)
GLUCOSE: 125 mg/dL — AB (ref 65–99)
POTASSIUM: 4.2 mmol/L (ref 3.5–5.1)
SODIUM: 136 mmol/L (ref 135–145)
Total Bilirubin: 0.7 mg/dL (ref 0.3–1.2)
Total Protein: 7.6 g/dL (ref 6.5–8.1)

## 2017-01-10 LAB — LIPASE, BLOOD: Lipase: 29 U/L (ref 11–51)

## 2017-01-10 NOTE — ED Notes (Signed)
Previous nurse in to see patient. States I'm leaving.  Encouraged to stay. Physician notified.

## 2017-01-10 NOTE — ED Triage Notes (Signed)
Pt complaining of abdominal pain x 1 week. Pt denies any N/V/D. Pt denies any urinary symptoms. Pt also complaining of sore throat, cough and headache. Pt denies any fevers/chills at home.

## 2017-01-10 NOTE — ED Provider Notes (Signed)
[facility]-EMERGENCY DEPT Provider Note   CSN: [id number] Arrival date & time: 01/10/17  1927     History   Chief Complaint Chief Complaint  Patient presents with  . Abdominal Pain  . Sore Throat    HPI Jerome Parker is a 31 y.o. male.  The history is provided by the patient.  Abdominal Pain   This is a new problem. The current episode started more than 2 days ago. The problem occurs constantly. The problem has not changed since onset.The pain is associated with eating. The pain is located in the LUQ and RUQ. The quality of the pain is aching. The pain is at a severity of 8/10. The pain is severe. Pertinent negatives include fever, diarrhea, nausea, vomiting, dysuria, hematuria and arthralgias. Nothing aggravates the symptoms. Nothing relieves the symptoms. Past workup does not include CT scan.    Past Medical History:  Diagnosis Date  . Tonsillitis     Patient Active Problem List   Diagnosis Date Noted  . Sore throat 01/26/2015  . Refugee health examination 11/27/2014  . Tobacco abuse 11/27/2014  . Immigrant with language difficulty 11/27/2014    History reviewed. No pertinent surgical history.     Home Medications    Prior to Admission medications   Medication Sig Start Date End Date Taking? Authorizing Provider  fluticasone (FLONASE) 50 MCG/ACT nasal spray Place 2 sprays into both nostrils daily. Patient not taking: Reported on 06/18/2015 01/26/15   Arvilla MarketWallace, Catherine Lauren, DO  HYDROcodone-acetaminophen [facility](NORCO) 5-325 MG tablet Take 1 tablet by mouth every 6 (six) hours as needed. Patient not taking: Reported on 08/21/2015 06/18/15   [name], Hope M, NP  ibuprofen (ADVIL,MOTRIN) 800 MG tablet Take 800 mg by mouth every 8 (eight) hours as needed for moderate pain. Reported on 08/21/2015    [provider]  lidocaine (XYLOCAINE) 2 % solution Use as directed 5 mLs in the mouth or throat every 4 (four) hours as needed for mouth pain. Swish for 15 seconds, then  spit. Patient not taking: Reported on 06/18/2015 12/31/14   Charm RingsHonig, Erin J, MD  omeprazole (PRILOSEC) 20 MG capsule Take 1 capsule (20 mg total) by mouth daily. Patient not taking: Reported on 06/18/2015 12/31/14   Charm RingsHonig, Erin J, MD  pseudoephedrine (SUDAFED) 120 MG 12 hr tablet Take 120 mg by mouth 2 (two) times daily. Reported on 08/21/2015    [provider]  valACYclovir (VALTREX) 1000 MG tablet Take 1 tablet (1,000 mg total) by mouth 2 (two) times daily. 11/20/16   Araceli Boucheumley, [name], DO    Family History History reviewed. No pertinent family history.  Social History Social History  Substance Use Topics  . Smoking status: Current Every Day Smoker  . Smokeless tobacco: Never Used  . Alcohol use No     Allergies   Patient has no known allergies.   Review of Systems Review of Systems  Constitutional: Negative for chills and fever.  HENT: Negative for ear pain and sore throat.   Eyes: Negative for pain and visual disturbance.  Respiratory: Negative for cough and shortness of breath.   Cardiovascular: Negative for chest pain and palpitations.  Gastrointestinal: Positive for abdominal pain. Negative for blood in stool, diarrhea, nausea and vomiting.  Genitourinary: Negative for dysuria and hematuria.  Musculoskeletal: Negative for arthralgias and back pain.  Skin: Negative for color change and rash.  Neurological: Negative for seizures and syncope.  All other systems reviewed and are negative.    Physical Exam Updated Vital Signs  BP 116/77   Pulse (!) 119   Temp 98.3 F (36.8 C) (Oral)   Resp 16   Wt 73.5 kg (162 lb 1 oz)   SpO2 98%   BMI 23.59 kg/m   Physical Exam  Constitutional: He appears well-developed and well-nourished. No distress.  HENT:  Head: Normocephalic and atraumatic.  Eyes: Conjunctivae are normal.  Neck: Neck supple.  Cardiovascular: Normal rate and regular rhythm.   No murmur heard. Pulmonary/Chest: Effort normal and breath sounds normal.  No respiratory distress.  Abdominal: Soft. He exhibits no distension and no mass. There is tenderness (tender in R middle abdomen and L middle abdomen). No hernia.  Musculoskeletal: He exhibits no edema.  Neurological: He is alert. No cranial nerve deficit. Coordination normal.  5/5 motor strength and intact sensation in all extremities. Intact bilateral finger-to-nose coordination  Skin: Skin is warm and dry.  Nursing note and vitals reviewed.    ED Treatments / Results  Labs (all labs ordered are listed, but only abnormal results are displayed) Labs Reviewed  COMPREHENSIVE METABOLIC PANEL - Abnormal; Notable for the following:       Result Value   Chloride 100 (*)    Glucose, Bld 125 (*)    ALT 13 (*)    All other components within normal limits  CBC - Abnormal; Notable for the following:    WBC 12.4 (*)    All other components within normal limits  URINALYSIS, ROUTINE W REFLEX MICROSCOPIC - Abnormal; Notable for the following:    Hgb urine dipstick SMALL (*)    All other components within normal limits  LIPASE, BLOOD    EKG  EKG Interpretation None       Radiology No results found.  Procedures Procedures (including critical care time)  Medications Ordered in ED Medications - No data to display   Initial Impression / Assessment and Plan / ED Course  I have reviewed the triage vital signs and the nursing notes.  Pertinent labs & imaging results that were available during my care of the patient were reviewed by me and considered in my medical decision making (see chart for details).     31 year old Arabic speaking male previously healthy who presents with 1 week of constant bilateral abdominal pain.  He reports pain began 6 days ago after a large Middle Eastern meal, which he has had before.  He recently participated in Ramadan with a long periods of fasting.  He has not previously had abdominal pain associated with Ramadan fasting.  Denies any vomiting, nausea,  or diarrhea.  He denies any abdominal trauma.  He takes ibuprofen once per week.  No recent fevers.  He has had no previous abdominal surgeries.  Denies testicular pain or dysuria.  He requests Flagyl without imaging, as this is what he has been prescribed previously in [place].  Afebrile, VSS.  Lungs good auscultation bilaterally.  Abdomen exquisitely tender in bilateral lower quadrants.  CBC showing WBC 12.4, Hgb 14.  CMP unremarkable.  Lipase 29.  UA without evidence of UTI.  Given concerning abdominal exam, unclear etiology.  Feel CT is warranted for further evaluation.  Patient unwilling to undergo CT and requests Flagyl prescription.  Unable to provide antibiotics without underlying diagnosis.  Patient contacting significant other regarding CT imaging.  On reassessment, patient noted to have eloped.  Pt care d/w Dr. Fredderick Phenix  Final Clinical Impressions(s) / ED Diagnoses   Final diagnoses:  Generalized abdominal pain    New Prescriptions Discharge Medication List  as of 01/10/2017 11:44 PM       Makyle Eslick, Homero Fellers, MD 01/11/17 [id number]    Rolan Bucco, MD 01/11/17 1353

## 2017-01-11 ENCOUNTER — Emergency Department (HOSPITAL_COMMUNITY): Payer: Medicaid Other

## 2017-01-11 ENCOUNTER — Encounter (HOSPITAL_COMMUNITY): Payer: Self-pay | Admitting: *Deleted

## 2017-01-11 ENCOUNTER — Emergency Department (HOSPITAL_COMMUNITY)
Admission: EM | Admit: 2017-01-11 | Discharge: 2017-01-11 | Disposition: A | Discharge status: Home / Self Care | Payer: Medicaid Other | Attending: Emergency Medicine | Admitting: Emergency Medicine

## 2017-01-11 DIAGNOSIS — R109 Unspecified abdominal pain: Secondary | ICD-10-CM | POA: Diagnosis present

## 2017-01-11 DIAGNOSIS — F17200 Nicotine dependence, unspecified, uncomplicated: Secondary | ICD-10-CM | POA: Diagnosis not present

## 2017-01-11 DIAGNOSIS — R1084 Generalized abdominal pain: Secondary | ICD-10-CM

## 2017-01-11 MED ORDER — IOPAMIDOL (ISOVUE-300) INJECTION 61%
INTRAVENOUS | Status: AC
  Administered 2017-01-11: 100 mL via INTRAVENOUS
  Filled 2017-01-11: qty 100

## 2017-01-11 MED ORDER — TRAMADOL HCL 50 MG PO TABS: 50.0000 mg | ORAL_TABLET | Freq: Four times a day (QID) | ORAL | 0 refills | Status: DC | PRN

## 2017-01-11 MED ORDER — PREDNISONE 20 MG PO TABS: 40.0000 mg | ORAL_TABLET | Freq: Every day | ORAL | 0 refills | Status: DC

## 2017-01-11 NOTE — Discharge Instructions (Signed)
Please be sure to follow-up with your physician, and with our specialist.  Take all medication as directed, and return here for concerning changes in your condition.

## 2017-01-11 NOTE — ED Provider Notes (Signed)
[facility]EMER[facility]ENCY DEPT Provider Note   CSN: [id number] Arrival date & time: 01/11/17  1128  By signing my name below, I, Jerome Parker, attest that this documentation has been prepared under the direction and in the presence of Jerome Munch, MD. Electronically Signed: Thelma Parker, Scribe. 01/11/17. 1:36 PM.  History   Chief Complaint Chief Complaint  Patient presents with  . Abdominal Pain   The history is provided by the patient. No language interpreter was used.    HPI Comments: Jerome Parker is a 31 y.o. male who presents to the Emergency Department complaining of waxing/waning, gradually worsening bilateral abdominal pain for 1 week. He notes after Ramadan ended, he started eating normally, when the pain began. He has taken tylenol with mild relief. He denies vomiting and diarrhea. Pt has no pertinent medical histories. He denies drinking alcohol. He denies any new exercise or change in activity. He was seen in the ED for this yesterday.   Past Medical History:  Diagnosis Date  . Tonsillitis     Patient Active Problem List   Diagnosis Date Noted  . Sore throat 01/26/2015  . Refugee health examination 11/27/2014  . Tobacco abuse 11/27/2014  . Immigrant with language difficulty 11/27/2014    Past Surgical History:  Procedure Laterality Date  . TONSILLECTOMY         Home Medications    Prior to Admission medications   Medication Sig Start Date End Date Taking? Authorizing Provider  fluticasone (FLONASE) 50 MCG/ACT nasal spray Place 2 sprays into both nostrils daily. Patient not taking: Reported on 06/18/2015 01/26/15   Arvilla Market, DO  HYDROcodone-acetaminophen [facility]) 5-325 MG tablet Take 1 tablet by mouth every 6 (six) hours as needed. Patient not taking: Reported on 08/21/2015 06/18/15   Janne Napoleon, NP  ibuprofen (ADVIL,MOTRIN) 800 MG tablet Take 800 mg by mouth every 8 (eight) hours as needed for moderate pain. Reported on 08/21/2015    [provider]  lidocaine (XYLOCAINE) 2 % solution Use as directed 5 mLs in the mouth or throat every 4 (four) hours as needed for mouth pain. Swish for 15 seconds, then spit. Patient not taking: Reported on 06/18/2015 12/31/14   Charm Rings, MD  omeprazole (PRILOSEC) 20 MG capsule Take 1 capsule (20 mg total) by mouth daily. Patient not taking: Reported on 06/18/2015 12/31/14   Charm Rings, MD  pseudoephedrine (SUDAFED) 120 MG 12 hr tablet Take 120 mg by mouth 2 (two) times daily. Reported on 08/21/2015    [provider]  valACYclovir (VALTREX) 1000 MG tablet Take 1 tablet (1,000 mg total) by mouth 2 (two) times daily. 11/20/16   Araceli Bouche, DO    Family History No family history on file.  Social History Social History  Substance Use Topics  . Smoking status: Current Every Day Smoker  . Smokeless tobacco: Never Used  . Alcohol use No     Allergies   Patient has no known allergies.   Review of Systems Review of Systems  Constitutional: Negative for fever.  Respiratory: Negative for shortness of breath.   Cardiovascular: Negative for chest pain.  Gastrointestinal: Positive for abdominal pain. Negative for diarrhea and vomiting.  Musculoskeletal:       Negative aside from HPI  Skin:       Negative aside from HPI  Allergic/Immunologic: Negative for immunocompromised state.  Neurological: Negative for weakness.     Physical Exam Updated Vital Signs BP 108/78 (BP Location: Right Arm)  Pulse 96   Temp 98.2 F (36.8 C) (Oral)   Resp 18   Ht 5' 8.9" (1.75 m)   Wt 163 lb (73.9 kg)   SpO2 99%   BMI 24.14 kg/m   Physical Exam  Constitutional: He is oriented to person, place, and time. He appears well-developed and well-nourished.  HENT:  Head: Normocephalic and atraumatic.  Cardiovascular: Normal rate.   Pulmonary/Chest: Effort normal.  Abdominal:  RLQ, LLQ TTP  Neurological: He is alert and oriented to person, place, and time.  Skin: Skin is warm  and dry.  Psychiatric: He has a normal mood and affect.  Nursing note and vitals reviewed.    ED Treatments / Results  DIAGNOSTIC STUDIES: Oxygen Saturation is 99% on RA, normal by my interpretation.    COORDINATION OF CARE: 1:36 PM Discussed treatment plan with pt at bedside and pt agreed to plan.   Radiology Ct Abdomen Pelvis W Contrast  Result Date: 01/11/2017 CLINICAL DATA:  Periumbilical pain for 1 week. EXAM: CT ABDOMEN AND PELVIS WITH CONTRAST TECHNIQUE: Multidetector CT imaging of the abdomen and pelvis was performed using the standard protocol following bolus administration of intravenous contrast. CONTRAST:  100mL ISOVUE-300 IOPAMIDOL (ISOVUE-300) INJECTION 61% COMPARISON:  None. FINDINGS: Lower chest: Lung bases are clear. Hepatobiliary: No focal hepatic lesion. No biliary duct dilatation. Gallbladder is normal. Common bile duct is normal. Pancreas: Pancreas is normal. No ductal dilatation. No pancreatic inflammation. Spleen: Normal spleen Adrenals/urinary tract: Adrenal glands and kidneys are normal. The ureters and bladder normal. Stomach/Bowel: Stomach and small bowel are normal. There is some thickening of the mucosa at the terminal ileum (image 42, series 6. There is mild thickening of the bowel wall distal ileum over proximally 6 cm (image 42, series 6. Appendix appears normal (image 53, series 3 The ascending, transverse, and descending colon are normal. Vascular/Lymphatic: Abdominal aorta is normal caliber. There is no retroperitoneal or periportal lymphadenopathy. No pelvic lymphadenopathy. Reproductive: Prostate normal for age Other: No free fluid. Musculoskeletal: No aggressive osseous lesion. IMPRESSION: 1. Subtle bowel wall thickening of the distal ileum and mucosal thickening at the ileocecal valve is nonspecific but could indicate inflammatory bowel disease (Crohn's disease). Terminal ileitis could have a similar pattern. 2. Appendix appears normal. 3. No renal obstruction.  Electronically Signed   By: Genevive BiStewart  Edmunds M.D.   On: 01/11/2017 15:28    Procedures Procedures (including critical care time)  Medications Ordered in ED Medications  iopamidol (ISOVUE-300) 61 % injection (100 mLs Intravenous Contrast Given 01/11/17 1457)   3:59 PM On repeat exam the patient is awake and alert, smiling, sitting upright, watching soccer. I discussed all findings with patient and his wife, we discussed the importance of following up with gastroenterology, primary care. We discussed the possibility of his recent dietary changes as contributed to his illness. Patient denies history of inflammatory bowel disease. Patient was started on a course of medication, discharged with close outpatient follow-up. With concern for inflammation, but no evidence for acute infection, including acute appendicitis, and after initiation of medication, outpatient follow-up.   Initial Impression / Assessment and Plan / ED Course  I have reviewed the triage vital signs and the nursing notes.  Pertinent labs & imaging results that were available during my care of the patient were reviewed by me and considered in my medical decision making (see chart for details).  Abdominal pain  Final Clinical Impressions(s) / ED Diagnoses   I personally performed the services described in this documentation, which was  scribed in my presence. The recorded information has been reviewed and is accurate.       Jerome Munch, MD 01/11/17 [phone]

## 2017-01-11 NOTE — ED Triage Notes (Signed)
PT was seen here last night for abdominal pain.  Refused CT at the time and was discharged.  Stated today that he would like to have the CT.

## 2017-01-19 ENCOUNTER — Encounter: Payer: Self-pay | Admitting: Student

## 2017-01-19 ENCOUNTER — Ambulatory Visit (INDEPENDENT_AMBULATORY_CARE_PROVIDER_SITE_OTHER): Payer: Medicaid Other | Admitting: Student

## 2017-01-19 VITALS — BP 110/62 | HR 89 | Temp 97.6°F | Ht 69.0 in | Wt 161.4 lb

## 2017-01-19 DIAGNOSIS — R1030 Lower abdominal pain, unspecified: Secondary | ICD-10-CM | POA: Diagnosis present

## 2017-01-19 MED ORDER — POLYETHYLENE GLYCOL 3350 17 GM/SCOOP PO POWD: 17.0000 g | Freq: Two times a day (BID) | ORAL | 1 refills | Status: DC | PRN

## 2017-01-19 NOTE — Patient Instructions (Signed)
It was great seeing you today! We have addressed the following issues today 1. Abdominal pain: We have done some blood tests today. If the blood test is concerning for Crohn's disease, I will send referral to gastroenterologist. Otherwise, we will discuss the plan. I also recommend trying MiraLAX for possible constipation.  If we did any lab work today, and the results require attention, either me or my nurse will get in touch with you. If everything is normal, you will get a letter in mail and a message via . If you don't hear from us in two weeks, please give us a call. Otherwise, we look forward to seeing you again at your next visit. If you have any questions or concerns before then, please call the clinic at [phone].  Please bring all your medications to every doctors visit  Sign up for My Chart to have easy access to your labs results, and communication with your Primary care physician.    Please check-out at the front desk before leaving the clinic.    Take Care,   Dr. Alanda SlimGonfa

## 2017-01-19 NOTE — Assessment & Plan Note (Signed)
Unclear about the etiology for his abdominal pain. His CT abdomen with subtle bowel wall thickening of the distal ileum and mucosal thickening of the ileocecal valve which is nonspecific but could be indication for Crohn's disease or terminal ileitis. Patient without diarrhea or blood in stool. I also wonder if this is due to GI flora change as a result of fasting. He has no constitutional symptoms or family history. He is actually gaining weight. Will obtain CBC with differential, ESR and CRP. Will also try MiraLAX. If ESR or CRP is elevated, will order referral to GI for further evaluation.

## 2017-01-19 NOTE — Progress Notes (Signed)
Subjective:    Jerome Parker is a 31 y.o. old male here for ED follow-up on abdominal pain  HPI Abdominal pain: for 10 days. Pain is over his lower abdomen bilaterally. Pain is intermittent. He was unable to characterize the pain. No association with meal. He was fasting for the month of Ramadan for a month prior. He went to ED about 8 days ago. He had a CT abdomen that showed subtle bowel wall thickening of the distal ileum and mucosal thickening of the ileocecal valve which is nonspecific but could be indication for Crohn's disease or terminal ileitis. He was discharged home on prednisone 40 mg daily and tramadol. With that his pain improved. However, he says his pain started again after he stopped prednisone. Urinalysis, CMP, lipase and CBC were negative except for leukocytosis to 12.6.  Patient denies nausea, vomiting, diarrhea, fever, hematochezia and melena. Reports bowel movement every 2-3 days. 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 Denies perianal lesion or skin lesion. Denies recent illness or new food. Denies family history of colon cancer or IBD. Denies drinking alcohol. Admits smoking 2-3 cigarettes a day. He is an immigrant from [place]. He is a Dealer and works on plane.   PMH/Problem List: has Refugee health examination; Tobacco abuse; Immigrant with language difficulty; Sore throat; and Lower abdominal pain on his problem list.   has a past medical history of Tonsillitis.  FH:  No family history on file.  [place] Social History  Substance Use Topics  . Smoking status: Current Every Day Smoker  . Smokeless tobacco: Never Used  . Alcohol use No    Review of Systems Review of systems negative except for pertinent positives and negatives in history of present illness above.     Objective:     Vitals:   01/19/17 1611  BP: 110/62  Pulse: 89  Temp: 97.6 F (36.4 C)  TempSrc: Oral  SpO2: 98%  Weight: 161 lb 6.4 oz (73.2 kg)  Height:  '5\' 9"'  (1.753 m)    Physical Exam GEN: appears well, no apparent distress. Eyes: conjunctiva without injection, sclera anicteric Oropharynx: mmm without erythema or exudation or lesion.  HEM: negative for cervical or periauricular lymphadenopathies CVS: RRR, nl S1&S2, no murmurs, no edema RESP: speaks in full sentence, no IWOB GI: BS present & normal, soft. Tenderness to palpation over lower abdomen bilaterally. No guarding or rebound tenderness. No hepatosplenomegaly MSK: no focal tenderness or notable swelling SKIN: no apparent skin lesion NEURO: alert and oiented appropriately, no gross defecits  PSYCH: euthymic mood with congruent affect    Assessment and Plan:  Lower abdominal pain Unclear about the etiology for his abdominal pain. His CT abdomen with subtle bowel wall thickening of the distal ileum and mucosal thickening of the ileocecal valve which is nonspecific but could be indication for Crohn's disease or terminal ileitis. Patient without diarrhea or blood in stool. I also wonder if this is due to GI flora change as a result of fasting. He has no constitutional symptoms or family history. He is actually gaining weight. Will obtain CBC with differential, ESR and CRP. Will also try MiraLAX. If ESR or CRP is elevated, will order referral to GI for further evaluation.   Orders Placed This Encounter  Procedures  . Sedimentation Rate  . CBC with Differential/Platelet  . C-reactive protein   Meds ordered this encounter  Medications  . polyethylene glycol powder (GLYCOLAX/MIRALAX) powder    Sig: Take 17 g by  mouth 2 (two) times daily as needed.    Dispense:  527 g    Refill:  1   Return if symptoms worsen or fail to improve.  Mercy Riding, MD 01/19/17 Pager: [phone]   Precepted patient with Dr. Andria Frames

## 2017-01-20 LAB — CBC WITH DIFFERENTIAL/PLATELET
BASOS ABS: 0.1 10*3/uL (ref 0.0–0.2)
Basos: 0 %
EOS (ABSOLUTE): 0.2 10*3/uL (ref 0.0–0.4)
Eos: 2 %
Hematocrit: 41.4 % (ref 37.5–51.0)
Hemoglobin: 13.8 g/dL (ref 13.0–17.7)
IMMATURE GRANS (ABS): 0.1 10*3/uL (ref 0.0–0.1)
Immature Granulocytes: 1 %
LYMPHS ABS: 3.5 10*3/uL — AB (ref 0.7–3.1)
LYMPHS: 31 %
MCH: 30 pg (ref 26.6–33.0)
MCHC: 33.3 g/dL (ref 31.5–35.7)
MCV: 90 fL (ref 79–97)
MONOCYTES: 6 %
Monocytes Absolute: 0.7 10*3/uL (ref 0.1–0.9)
NEUTROS PCT: 60 %
Neutrophils Absolute: 6.6 10*3/uL (ref 1.4–7.0)
Platelets: 372 10*3/uL (ref 150–379)
RBC: 4.6 x10E6/uL (ref 4.14–5.80)
RDW: 14.6 % (ref 12.3–15.4)
WBC: 11.3 10*3/uL — AB (ref 3.4–10.8)

## 2017-01-20 LAB — SEDIMENTATION RATE: SED RATE: 2 mm/h (ref 0–15)

## 2017-01-20 LAB — C-REACTIVE PROTEIN: CRP: 2.4 mg/L (ref 0.0–4.9)

## 2017-01-22 ENCOUNTER — Telehealth: Payer: Self-pay | Admitting: Family Medicine

## 2017-01-22 ENCOUNTER — Other Ambulatory Visit: Payer: Self-pay | Admitting: Student

## 2017-01-22 ENCOUNTER — Encounter: Payer: Self-pay | Admitting: Student

## 2017-01-22 DIAGNOSIS — R1030 Lower abdominal pain, unspecified: Secondary | ICD-10-CM

## 2017-01-22 NOTE — Progress Notes (Signed)
CBC, CRP and ESR negative except for mildly elevated WBC. Discussed this with patient using pacific interpretor. Result mailed to patient.

## 2017-01-22 NOTE — Telephone Encounter (Signed)
Would like test results. °

## 2017-01-22 NOTE — Telephone Encounter (Signed)
I am covering for Dr. Gwendolyn GrantWalden who is away from the office.  I am forwarding this to Dr. Alanda SlimGonfa, who saw patient earlier this week.  Latrelle DodrillMcIntyre, Gerik Coberly J, MD

## 2017-01-22 NOTE — Progress Notes (Signed)
Called patient using a pacific interpretor with ID number [phone] to discuss about his result from his recent visit. Patient continues to complain abdominal pain. He says he tried Miralax without improvement. His ESR and CRP are negative. His WBC is 11.2, improved from his prior. I asked him if he can come to clinic for lab visit and give [place] stool for fecal calprotectin or lactoferrin while awaiting on GI referral.  I told him this would be helpful to GI doctor and expedites things when they see him. He refuses this saying, "I will rather see stomach doctor". He GI referral was ordered. Patient has no further question.

## 2017-02-26 ENCOUNTER — Other Ambulatory Visit: Payer: Self-pay | Admitting: Gastroenterology

## 2017-02-26 DIAGNOSIS — R1011 Right upper quadrant pain: Secondary | ICD-10-CM

## 2017-03-02 ENCOUNTER — Other Ambulatory Visit: Payer: Medicaid Other

## 2017-03-04 ENCOUNTER — Ambulatory Visit
Admission: RE | Admit: 2017-03-04 | Discharge: 2017-03-04 | Disposition: A | Discharge status: Home / Self Care | Payer: Medicaid Other | Source: Ambulatory Visit | Attending: Gastroenterology | Admitting: Gastroenterology

## 2017-03-04 DIAGNOSIS — R1011 Right upper quadrant pain: Secondary | ICD-10-CM

## 2017-03-05 ENCOUNTER — Other Ambulatory Visit: Payer: Medicaid Other

## 2017-03-06 ENCOUNTER — Other Ambulatory Visit: Payer: Medicaid Other

## 2017-10-12 IMAGING — US US ABDOMEN LIMITED
1 series · 14 of 25 positions shown · non-contrast
Comparison: CT 01/11/2017.

CLINICAL DATA: Right upper quadrant pain.

EXAM:
ULTRASOUND ABDOMEN LIMITED RIGHT UPPER QUADRANT

[Series 1: us abdomen limited · 0.22mm/px · 14 of 48 slices shown]
[im 1/48]
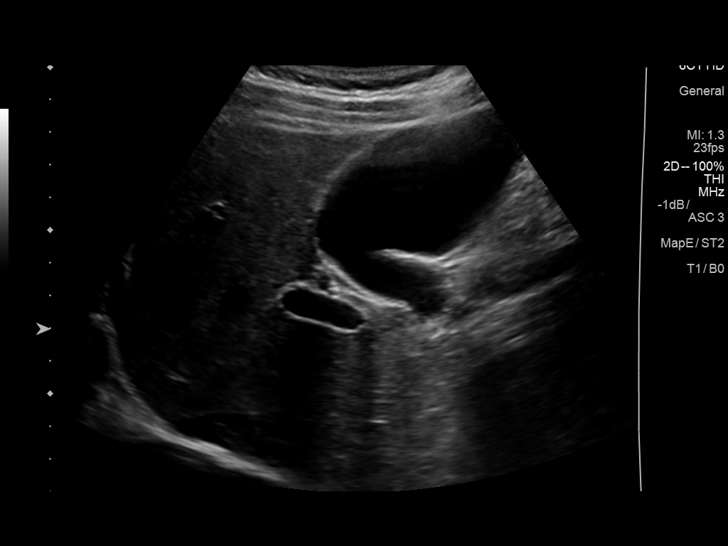
[im 4/48]
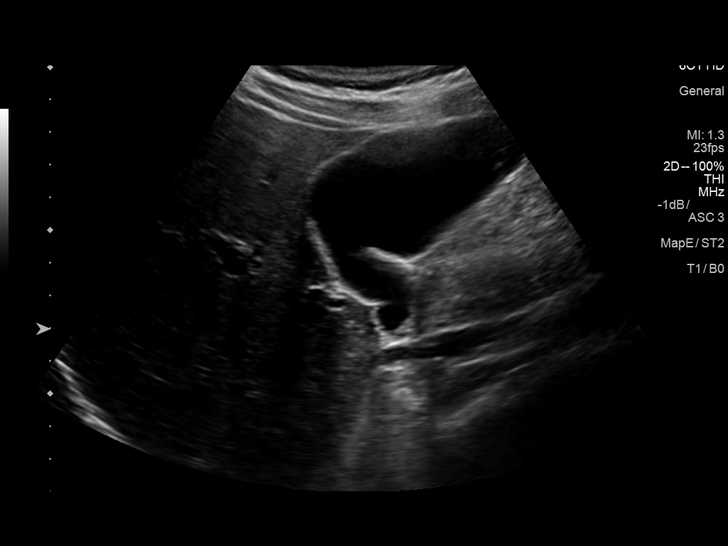
[im 8/48]
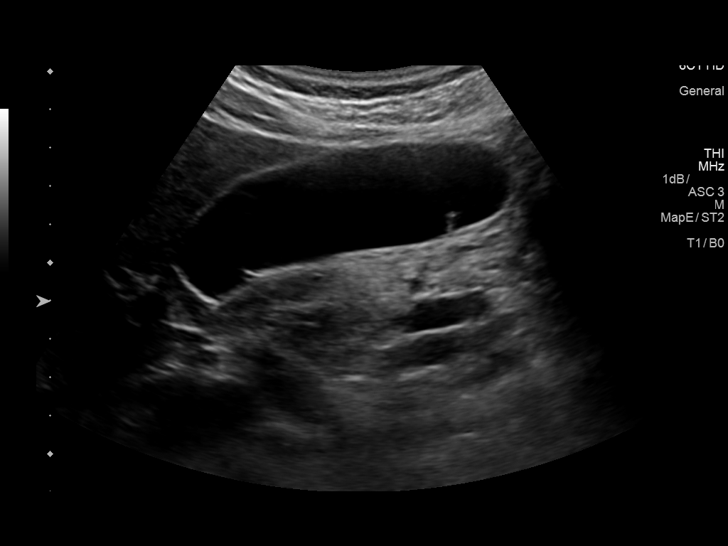
[im 12/48]
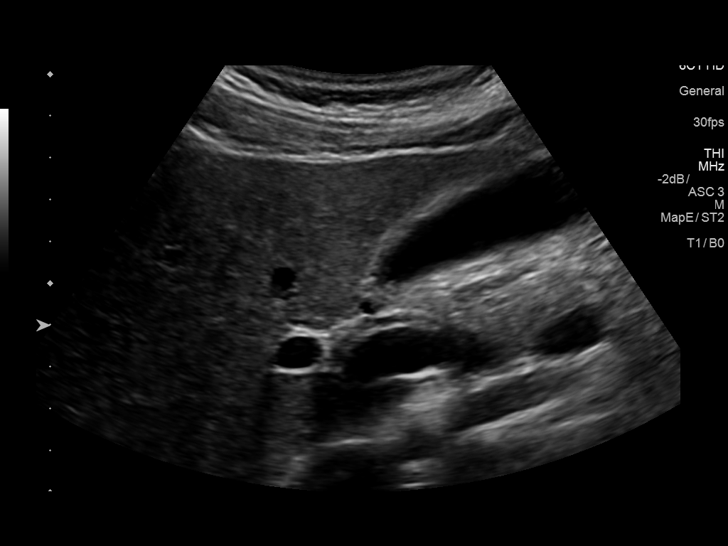
[im 16/48]
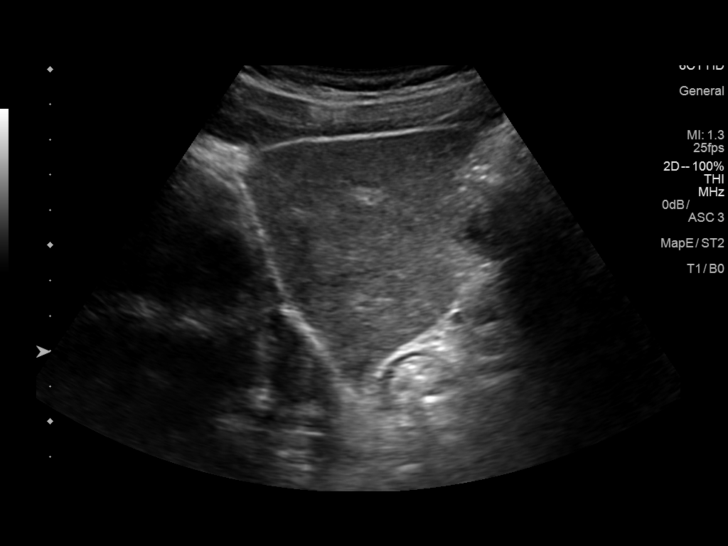
[im 18/48]
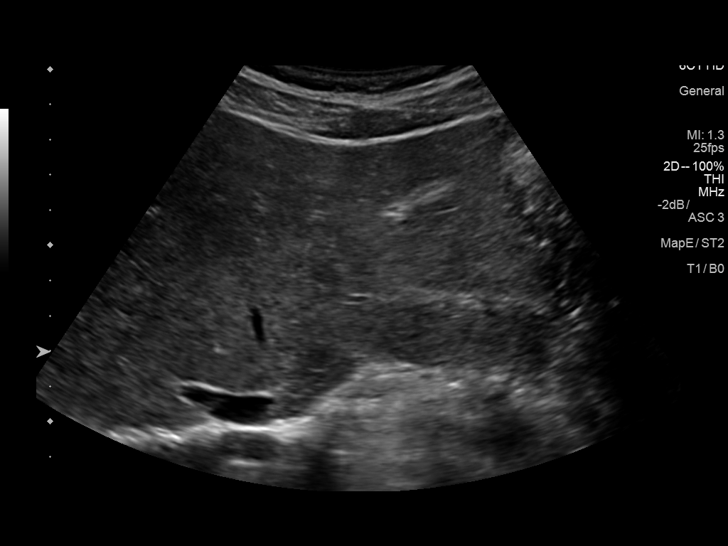
[im 22/48]
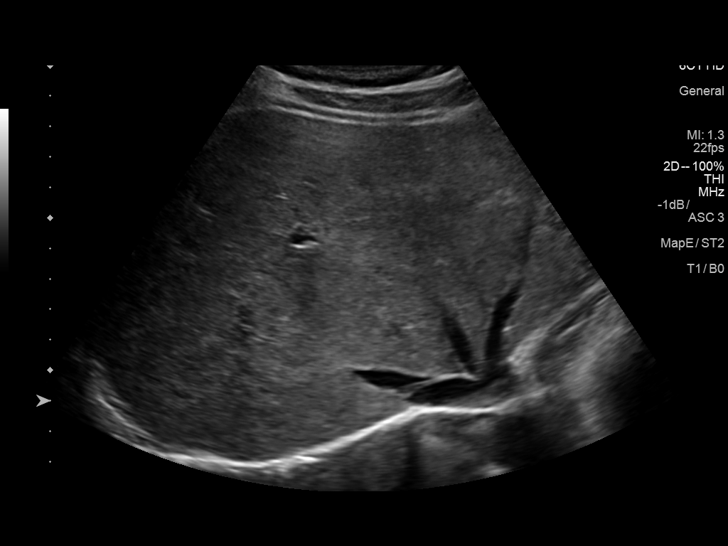
[im 26/48]
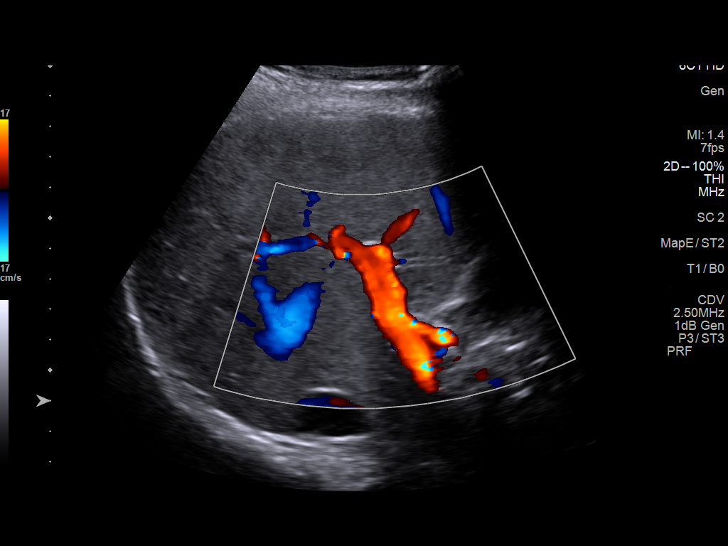
[im 30/48]
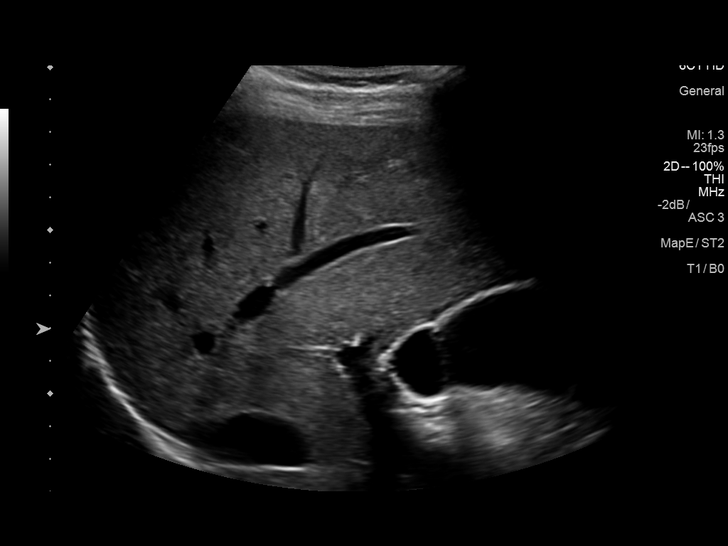
[im 32/48]
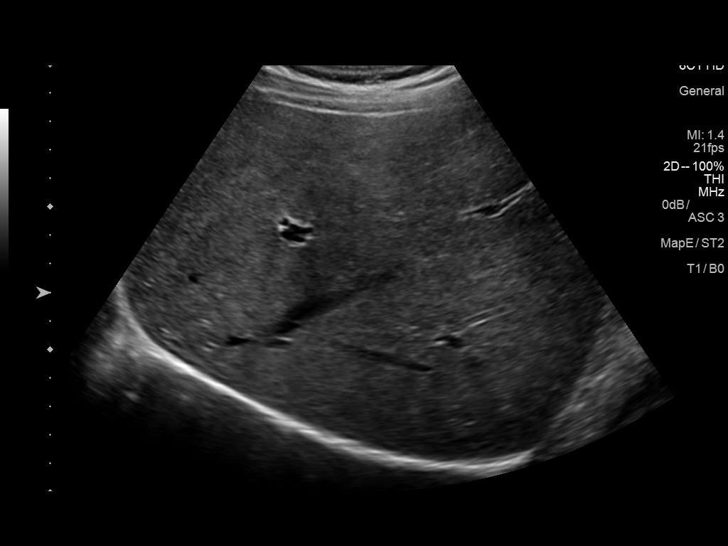
[im 36/48]
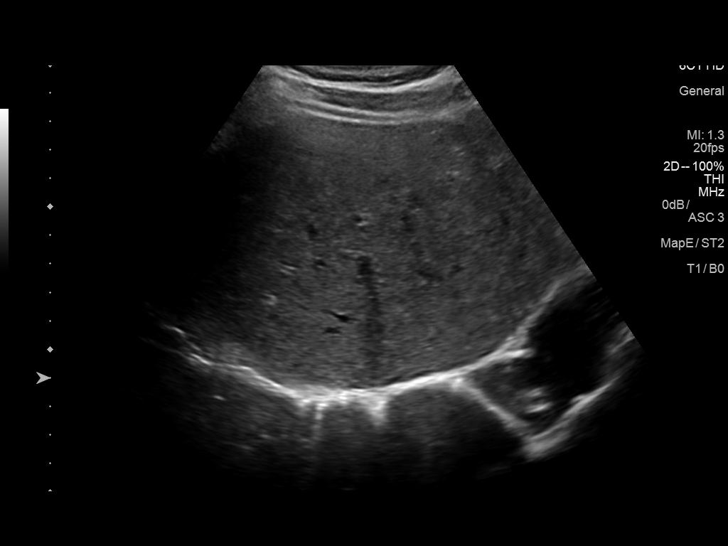
[im 40/48]
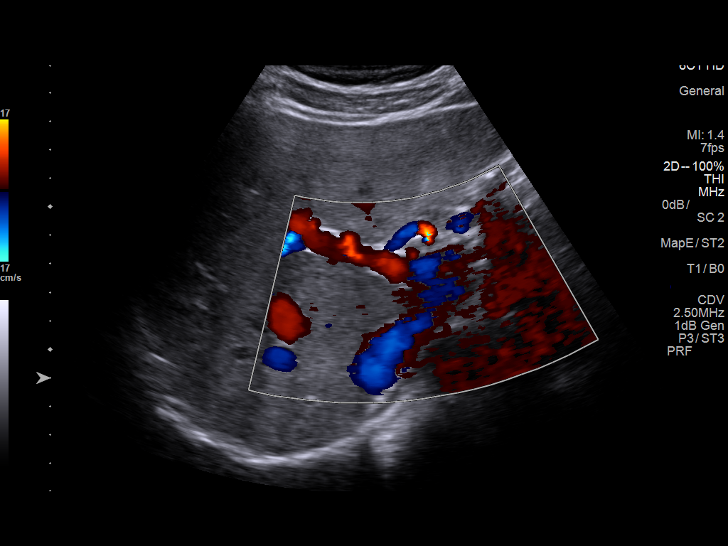
[im 44/48]
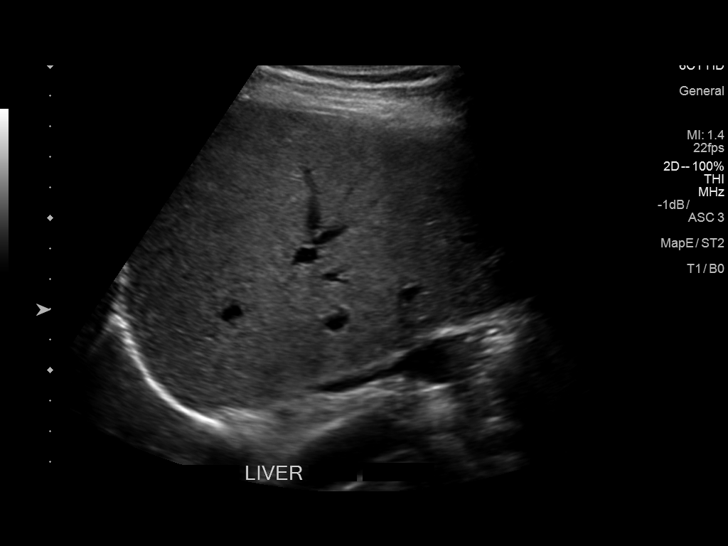
[im 48/48]
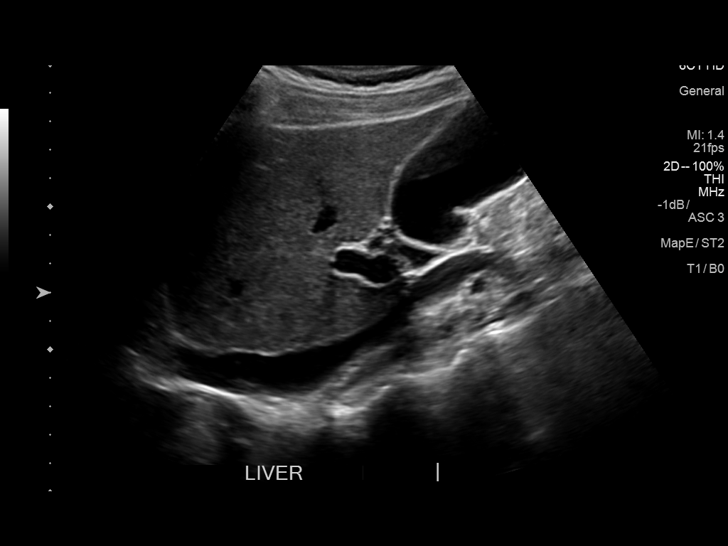

[14 of 25 positions shown; findings below may reference images not displayed]

FINDINGS: Gallbladder:

No gallstones or wall thickening visualized. No sonographic Murphy
sign noted by sonographer.

Common bile duct:

Diameter: 2.0 mm

Liver:

No focal lesion identified. Within normal limits in parenchymal
echogenicity.
IMPRESSION: No acute or focal abnormality.

## 2019-08-07 ENCOUNTER — Other Ambulatory Visit: Payer: Self-pay

## 2019-08-07 ENCOUNTER — Emergency Department (HOSPITAL_COMMUNITY)
Admission: EM | Admit: 2019-08-07 | Discharge: 2019-08-07 | Disposition: A | Discharge status: Home / Self Care | Payer: Medicaid Other | Attending: Emergency Medicine | Admitting: Emergency Medicine

## 2019-08-07 ENCOUNTER — Encounter (HOSPITAL_COMMUNITY): Payer: Self-pay | Admitting: Emergency Medicine

## 2019-08-07 DIAGNOSIS — R509 Fever, unspecified: Secondary | ICD-10-CM | POA: Diagnosis not present

## 2019-08-07 DIAGNOSIS — K0889 Other specified disorders of teeth and supporting structures: Secondary | ICD-10-CM | POA: Insufficient documentation

## 2019-08-07 DIAGNOSIS — Z5321 Procedure and treatment not carried out due to patient leaving prior to being seen by health care provider: Secondary | ICD-10-CM | POA: Insufficient documentation

## 2019-08-07 NOTE — ED Triage Notes (Addendum)
Pt reports LL dental pain that he thinks is coming from his wisdom teeth, denies any other symptoms or recent sick contacts, however temp is 102.9 at triage-pt declined tylenol at triage. Pt states he has a dentist that removed his upper wisdom teeth but cannot see them today since its Sunday.

## 2019-08-07 NOTE — ED Notes (Signed)
Pt up to sort desk stating that he got in touch with his dentist and is going to see them now so he requested to be taken out of the system.

## 2020-05-01 ENCOUNTER — Encounter: Payer: Self-pay | Admitting: Family Medicine

## 2020-05-01 ENCOUNTER — Other Ambulatory Visit: Payer: Self-pay

## 2020-05-01 ENCOUNTER — Ambulatory Visit (INDEPENDENT_AMBULATORY_CARE_PROVIDER_SITE_OTHER): Payer: Medicaid Other | Admitting: Family Medicine

## 2020-05-01 VITALS — BP 116/72 | HR 101 | Ht 68.0 in | Wt 174.6 lb

## 2020-05-01 DIAGNOSIS — M549 Dorsalgia, unspecified: Secondary | ICD-10-CM | POA: Insufficient documentation

## 2020-05-01 DIAGNOSIS — R0789 Other chest pain: Secondary | ICD-10-CM | POA: Insufficient documentation

## 2020-05-01 NOTE — Progress Notes (Signed)
    SUBJECTIVE:   CHIEF COMPLAINT / HPI:  Pt had covid about two months ago.  Around this time he had left rib pain.  His covid infection was mild and he did not develop a significant cough.  He was worked up at the [facility] as well as [facility] for chest pain.  Both instances he states his tests were 'negative'.  The pain does not worsen with inhlation or exertion.  It is on the left midaxillary region.  He is not currently taking medications for the pain.    Pt smokes 1/4 ppd for past 16 years.  Does not drink alcohol. Does not use drugs.      Married.  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  Has a son and daughter.    PERTINENT  PMH / PSH: covid  OBJECTIVE:   BP 116/72   Pulse (!) 101   Ht 5\' 8"  (1.727 m)   Wt 174 lb 9.6 oz (79.2 kg)   SpO2 98%   BMI 26.55 kg/m   Gen: alert no acute distress. Translator accompanying pt.  CV RRR.  Pulm: LCTAB MSK: TTP on the left midaxillary ribs near the 8th rib.  No masses. No bruising.    ASSESSMENT/PLAN:   Musculoskeletal chest pain Appears to be MSK based on exam.  Not concerning for cardio or pulmonary origin.  Previous workups negative per patient.  Doubtful it is related to covid as pt had similar symptoms prior to getting covid.  Pt to bring in further records from [facility].  He declined further labwork today.  Advised pt he can use his diclofenac gel on the area.       Turnbull Thomson, MD [facility]

## 2020-05-01 NOTE — Assessment & Plan Note (Signed)
Appears to be MSK based on exam.  Not concerning for cardio or pulmonary origin.  Previous workups negative per patient.  Doubtful it is related to covid as pt had similar symptoms prior to getting covid.  Pt to bring in further records from [facility].  He declined further labwork today.  Advised pt he can use his diclofenac gel on the area.

## 2020-05-01 NOTE — Patient Instructions (Signed)
It was nice to meet you today,  For your chest pain, I think this is due to a muscle strain.  You can put the diclofenac gel on the area up to 4 times a day.  You can also take Tylenol as needed for pain.  For your teeth, I have given you a list of dentists that should take your Medicaid.  Please call them to see if they will take your insurance.  Have a great day,  Frederic Jericho, MD

## 2020-08-02 NOTE — Progress Notes (Signed)
    SUBJECTIVE:   CHIEF COMPLAINT / HPI:   Check up:  Patient continues to have left-sided rib pain.  He first noticed it after getting COVID.  It is painful to the touch.  Does not get worse with inspiration or exertion.  He has tried rubbing all of oil and other "traditional medicines" on it he has been taking NSAIDs but stopped because he states they were giving him ulcers in his mouth.  He had this worked up at his previous clinic but does not have the records.  Bilateral ear pain: Patient feels like his ears are stuffed up and are painful.  Feels like there is increased pressure in his ears.  No decreased hearing.  No dizziness.  He will occasionally use a Q-tip in his ear.  Sometimes he will close his nostrils and mouth and breathe out in an attempt to unclog them.  Patient wants to know if he should get the COVID-vaccine.  He had COVID previously a few months ago.  He lost his sense of taste and smell for a month afterwards, but now things still taste differently   PERTINENT  PMH / PSH: COVID  OBJECTIVE:   BP 102/64   Pulse 99   Ht 5\' 8"  (1.727 m)   Wt 175 lb 12.8 oz (79.7 kg)   SpO2 97%   BMI 26.73 kg/m   General: Alert and oriented.  No acute distress. HEENT: TM normal bilaterally no bulging or erythema.  No redness or swelling of the ear canals bilaterally. CV: Regular rate and rhythm Pulmonary: Lungs clear to auscultation bilaterally MSK: Tender to palpation on the anterior rib cage along the 10th rib  ASSESSMENT/PLAN:   Musculoskeletal chest pain Continues to be bothersome for patient.  Some of this may be due to hyper awareness and preoccupation of these symptoms.  Patient signed records release request for so we can evaluate further.  Do not believe it is pulmonary or cardiac at this time.  Advised patient to use Voltaren gel  Ear pain, bilateral History and exam consistent with eustachian tube dysfunction.  Prescribed Flonase.  Advised patient not to stick  anything in his ear.  History of COVID-19 Discussed with patient that dysgeusia  resolves at varying time frames depending on the patient.  Recommended patient get COVID vaccine.  Unable to give it in clinic today due to timing issue.  Vitamin D deficiency Patient brought partial records from [facility] which showed vitamin D dysfunction that was treated with 50,000 IU vitamin D weekly.  Advised patient to return to clinic for lab visit to have vitamin D measured.     [place], MD [facility]

## 2020-08-03 ENCOUNTER — Other Ambulatory Visit: Payer: Self-pay

## 2020-08-03 ENCOUNTER — Ambulatory Visit: Payer: Medicaid Other | Admitting: Family Medicine

## 2020-08-03 ENCOUNTER — Encounter: Payer: Self-pay | Admitting: Family Medicine

## 2020-08-03 VITALS — BP 102/64 | HR 99 | Ht 68.0 in | Wt 175.8 lb

## 2020-08-03 DIAGNOSIS — R0789 Other chest pain: Secondary | ICD-10-CM | POA: Diagnosis not present

## 2020-08-03 DIAGNOSIS — H9203 Otalgia, bilateral: Secondary | ICD-10-CM

## 2020-08-03 DIAGNOSIS — Z8616 Personal history of COVID-19: Secondary | ICD-10-CM | POA: Diagnosis not present

## 2020-08-03 DIAGNOSIS — E559 Vitamin D deficiency, unspecified: Secondary | ICD-10-CM | POA: Diagnosis present

## 2020-08-03 MED ORDER — DICLOFENAC SODIUM 1 % EX GEL: 2.0000 g | Freq: Four times a day (QID) | CUTANEOUS | 3 refills | Status: DC

## 2020-08-03 MED ORDER — FLUTICASONE PROPIONATE 50 MCG/ACT NA SUSP: 1.0000 | Freq: Every day | NASAL | 12 refills | Status: DC

## 2020-08-03 NOTE — Patient Instructions (Addendum)
It was nice to see you again,   For your ear pain, use the flonase nasal spray in each nostril once a day.    For your rib pain, you can rub the voltaren gel on it 4 times a day.    I will get the results from [facility] and call you when I receive them.    You can get the covid vaccine at any pharmacy.   Have a great day,   Frederic Jericho, MD

## 2020-08-05 DIAGNOSIS — E559 Vitamin D deficiency, unspecified: Secondary | ICD-10-CM | POA: Insufficient documentation

## 2020-08-05 DIAGNOSIS — Z8616 Personal history of COVID-19: Secondary | ICD-10-CM | POA: Insufficient documentation

## 2020-08-05 DIAGNOSIS — H9203 Otalgia, bilateral: Secondary | ICD-10-CM | POA: Insufficient documentation

## 2020-08-05 NOTE — Assessment & Plan Note (Signed)
Discussed with patient that dysgeusia  resolves at varying time frames depending on the patient.  Recommended patient get COVID vaccine.  Unable to give it in clinic today due to timing issue.

## 2020-08-05 NOTE — Assessment & Plan Note (Signed)
Continues to be bothersome for patient.  Some of this may be due to hyper awareness and preoccupation of these symptoms.  Patient signed records release request for [facility] so we can evaluate further.  Do not believe it is pulmonary or cardiac at this time.  Advised patient to use Voltaren gel

## 2020-08-05 NOTE — Assessment & Plan Note (Signed)
Patient brought partial records from [facility] which showed vitamin D dysfunction that was treated with 50,000 IU vitamin D weekly.  Advised patient to return to clinic for lab visit to have vitamin D measured.

## 2020-08-05 NOTE — Assessment & Plan Note (Signed)
History and exam consistent with eustachian tube dysfunction.  Prescribed Flonase.  Advised patient not to stick anything in his ear.

## 2021-01-22 ENCOUNTER — Ambulatory Visit: Payer: Medicaid Other | Admitting: Family Medicine

## 2021-05-14 ENCOUNTER — Ambulatory Visit: Payer: Medicaid Other | Admitting: Family Medicine

## 2021-05-14 ENCOUNTER — Encounter: Payer: Self-pay | Admitting: Family Medicine

## 2021-05-14 ENCOUNTER — Other Ambulatory Visit: Payer: Self-pay

## 2021-05-14 ENCOUNTER — Ambulatory Visit
Admission: RE | Admit: 2021-05-14 | Discharge: 2021-05-14 | Disposition: A | Discharge status: Home / Self Care | Payer: Medicaid Other | Source: Ambulatory Visit | Attending: Family Medicine | Admitting: Family Medicine

## 2021-05-14 VITALS — BP 102/80 | HR 82 | Ht 69.0 in | Wt 160.0 lb

## 2021-05-14 DIAGNOSIS — R0781 Pleurodynia: Secondary | ICD-10-CM | POA: Diagnosis not present

## 2021-05-14 DIAGNOSIS — R109 Unspecified abdominal pain: Secondary | ICD-10-CM | POA: Diagnosis present

## 2021-05-14 DIAGNOSIS — R0789 Other chest pain: Secondary | ICD-10-CM | POA: Diagnosis not present

## 2021-05-14 DIAGNOSIS — G8929 Other chronic pain: Secondary | ICD-10-CM

## 2021-05-14 LAB — POCT URINALYSIS DIP (MANUAL ENTRY)
Bilirubin, UA: NEGATIVE
Blood, UA: NEGATIVE
Glucose, UA: NEGATIVE mg/dL
Leukocytes, UA: NEGATIVE
Nitrite, UA: NEGATIVE
Protein Ur, POC: NEGATIVE mg/dL
Spec Grav, UA: 1.025 (ref 1.010–1.025)
Urobilinogen, UA: 0.2 E.U./dL
pH, UA: 5.5 (ref 5.0–8.0)

## 2021-05-14 MED ORDER — ACETAMINOPHEN ER 650 MG PO TBCR: 1300.0000 mg | EXTENDED_RELEASE_TABLET | Freq: Two times a day (BID) | ORAL | 1 refills | Status: AC

## 2021-05-14 MED ORDER — MELOXICAM 15 MG PO TABS: 15.0000 mg | ORAL_TABLET | Freq: Every day | ORAL | 0 refills | Status: DC

## 2021-05-14 MED ORDER — DICLOFENAC SODIUM 1 % EX GEL: 4.0000 g | Freq: Four times a day (QID) | CUTANEOUS | 3 refills | Status: AC

## 2021-05-14 NOTE — Patient Instructions (Addendum)
Thank you for coming to see me today. It was a pleasure.   I have placed an order for chest xray.  Please go to [facility] [employer] [facility] or at [facility] to have this completed.  You do not need an appointment, but if you would like to call them beforehand, their number is [phone].  We will contact you with your results afterwards.   Please follow-up with me on Nov 9 at 330 pm  If you have any questions or concerns, please do not hesitate to call the office at [phone].  Best,   Dana Allan, MD    ??? ????? ????? Chest Wall Pain ????? ????? ????? ?? ??? ?? ???? ????? ??????? ?? ?????. ??????? ?? ???? ??? ????? ?? ?????. ??? ???? ???? ????? ???? ?????? ?????? ?? ??????? ?? ??????? ????? ?????? ??????. ???????? ???? ?? ???? ????? ???????. ???? ?? ?????? ??? ????? ??? ?????? ?? ???? ??? ?? ?????. ???? ??? ????????? ?? ??????: ??????? ??? ????? ??????? ???????  ?? ??? ??? ???? ?????? ??? ??? ?????? ???: ?? ????? ?? ??? ???????. ?? ????? ??? ???? ??????. ???? ????? ??? ??????? ???? 20 ?????? ?????? ??? 2-3 ???? ?? ?????. ?????? ???? ??? ??? ??? ?? ?????? ??? ??????? ???? ??????? ?????? ??????? ??. ???? ??????? ???? ???? ?????. ????? ??? ?? ??????? ???? ?????? ????? ????? ?? ????? ???????? ???????? ???? ???? ?????. ?????? ???? ??????? ?????? ???? ??????? ?????? ??. ??????? ????  ????? ??????? ??? ??????? ???? ??????? ??????? ????? ????? ????? ???? ?? ??? ???? ????. ?? ?????? ?????? ????? ??? ????????? ?? ????? ??? ??????? ???????? ??????????? ???? ?????. ???? ???? ???????. ????? ???? ??????? ?????? ??? ??? ????? ??? ???????? ??????? ?? ???????. ???? ??? ???? ???? ?????? ???????? ??? ??????? ???? ??????? ?????? ??????? ??. ???? ??? ???. ???? ?????? ??????? ?????? ?? ??????? ???????: ??????? ????. ?????? ??? ????? ????. ???? ????? ????? ????. ???? ???????? ??? ????? ?? ??????? ???????: ??????? ???????? ?? ?????. ?????? ?? ?????? ???????. ?????? ???????  ????? (??????) ?? ???? ??. ??????? ???? ??????. ???? ???? ??? ??????? ????? ????? ????? ???? ???? ?????. ??? ?? ????? ??? ?? ???? ???????. ?? ???? ???????? ?????? ??? ?????. ???? ?????? ??????? ??????? (911 ?? ???????? ??????? ?????????). ?? ??? ??????? ????? ??? ????????. ???? ????? ????? ????? ?? ??? ?? ???? ????? ??????? ?? ?????. ????? ??? ??? ????? ??????? ???? ???? ????? ??????? ???????? ????????? ????? ??????? ???? ???? ?????. ???? ???????? ??????? ?????? ?? ???? ??????? ???? ?? ???? ????? ?????? ?? ???? ???? ????? ?????. ???? ???????? ????? ??? ???? ??????? ?? ???? ???? ?? ??????. ??? ???? ??? ??????? ????? ??? ???? ???? ?????. ??? ????? ?? ??? ????????? ?? ???? ?????? ????????? ???? ?????? ???? ??????? ??????. ???? ?? ?????? ??? ????? ???? ?? ???? ?? ???? ??????? ??????.? Document Revised: 10/08/2020 Document Reviewed: 10/08/2020 Elsevier Patient Education  2022 [employer].

## 2021-05-14 NOTE — Progress Notes (Signed)
    SUBJECTIVE:   CHIEF COMPLAINT / HPI: Left side pain, would like chest xray  Patient presenting with left side rib pain.Has been ongoing for 3 weeks.  Similar symptoms 6 months ago and treated with Voltaren gel.  Some mild relief with medication but has run out. Using heated olive oil that also helps. Denies any cough, shortness of breath or fevers.  No recent trauma and does not recall previous history of trauma to affected area.  Works as Retail banker, reaching, pulling and overhead arm work.     PERTINENT  PMH / PSH:  Tobacco use  OBJECTIVE:   BP 102/80   Pulse 82   Ht 5\' 9"  (1.753 m)   Wt 160 lb (72.6 kg)   SpO2 98%   BMI 23.63 kg/m    General: Alert, no acute distress Cardio: Normal S1 and S2, RRR, no r/m/g Pulm: CTAB, normal work of breathing Left anterolateral chest wall tenderness without erythema, edema, rash   ASSESSMENT/PLAN:   Musculoskeletal chest pain Likely MSK pain given tenderness on palpation and some relief with NSAID gel. Given continued pain for and smoking history will obtain chest xray to r/o stress fracture. Less likely renal involvement given no flank pain or urinary symptoms.   -2 view chest xray -Meloxicam 15 mg daily x 14 days -Tylenol 1300 mg BID x 14 days -Diclofenac gel QID -Recommend smoking cessation. -Follow up scheduled for 2 weeks.  -Strict return precautions provided.     , MD [facility]

## 2021-05-16 ENCOUNTER — Encounter: Payer: Self-pay | Admitting: Family Medicine

## 2021-05-16 NOTE — Assessment & Plan Note (Addendum)
Likely MSK pain given tenderness on palpation and some relief with NSAID gel. Given continued pain for and smoking history will obtain chest xray to r/o stress fracture. Less likely renal involvement given no flank pain or urinary symptoms.   -2 view chest xray -Meloxicam 15 mg daily x 14 days -Tylenol 1300 mg BID x 14 days -Diclofenac gel QID -Recommend smoking cessation. -Follow up scheduled for 2 weeks.  -Strict return precautions provided.

## 2021-05-29 ENCOUNTER — Ambulatory Visit: Payer: Medicaid Other | Admitting: Family Medicine

## 2021-05-29 NOTE — Patient Instructions (Incomplete)
Thank you for coming to see me today. It was a pleasure.  ° °*** ° °Please follow-up with *** in *** ° °If you have any questions or concerns, please do not hesitate to call the office at [phone]. ° °Best,  ° °Sebastain Fishbaugh, MD   °

## 2021-05-29 NOTE — Progress Notes (Deleted)
    SUBJECTIVE:   CHIEF COMPLAINT / HPI:   ***  PERTINENT  PMH / PSH: ***  OBJECTIVE:   There were no vitals taken for this visit.   General: Alert, no acute distress Cardio: Normal S1 and S2, RRR, no r/m/g Pulm: CTAB, normal work of breathing Abdomen: Bowel sounds normal. Abdomen soft and non-tender.  Extremities: No peripheral edema.  Neuro: Cranial nerves grossly intact   ASSESSMENT/PLAN:   No problem-specific Assessment & Plan notes found for this encounter.     Aivan Fillingim, MD [facility]   

## 2021-12-02 ENCOUNTER — Encounter (HOSPITAL_COMMUNITY): Payer: Self-pay | Admitting: Emergency Medicine

## 2021-12-02 ENCOUNTER — Ambulatory Visit (HOSPITAL_COMMUNITY)
Admission: EM | Admit: 2021-12-02 | Discharge: 2021-12-02 | Disposition: A | Discharge status: Home / Self Care | Payer: Medicaid Other | Attending: Sports Medicine | Admitting: Sports Medicine

## 2021-12-02 DIAGNOSIS — R0982 Postnasal drip: Secondary | ICD-10-CM

## 2021-12-02 DIAGNOSIS — J302 Other seasonal allergic rhinitis: Secondary | ICD-10-CM | POA: Diagnosis not present

## 2021-12-02 DIAGNOSIS — J069 Acute upper respiratory infection, unspecified: Secondary | ICD-10-CM

## 2021-12-02 MED ORDER — BENZONATATE 100 MG PO CAPS: 100.0000 mg | ORAL_CAPSULE | Freq: Three times a day (TID) | ORAL | 0 refills | Status: AC

## 2021-12-02 MED ORDER — KETOROLAC TROMETHAMINE 30 MG/ML IJ SOLN
30.0000 mg | Freq: Once | INTRAMUSCULAR | Status: AC
  Administered 2021-12-02: 30 mg via INTRAMUSCULAR

## 2021-12-02 MED ORDER — CETIRIZINE HCL 10 MG PO TABS: 10.0000 mg | ORAL_TABLET | Freq: Two times a day (BID) | ORAL | 0 refills | Status: AC

## 2021-12-02 MED ORDER — KETOROLAC TROMETHAMINE 30 MG/ML IJ SOLN
INTRAMUSCULAR | Status: AC
  Filled 2021-12-02: qty 1

## 2021-12-02 NOTE — Discharge Instructions (Signed)
WE will give you a shot of Toradol today, this should help your throat pain ? ?-Take Tessalon perles 1 capsule every 8 hours as needed for cough ?-I do think there may be an allergy component as well, this will help your nasal congestion to take cetirizine (Zyrtec) 1 tablet twice a day for 10 days ? ?-Would expect this to get better over the next 7-10 days, if it does not follow-up with PCP or here ?

## 2021-12-02 NOTE — ED Provider Notes (Signed)
?[facility] ? ? ? ?CSN: [id number] ?Arrival date & time: 12/02/21  1227 ? ? ?  ? ?History   ?Chief Complaint ?Chief Complaint  ?Patient presents with  ? Cough  ? Fever  ? Nasal Congestion  ? Fatigue  ? ? ?HPI ?Jerome Parker is a 36 y.o. male here for cough, nasal congestion, malaise. ? ? ?Cough ?Associated symptoms: chills, myalgias and sore throat   ?Associated symptoms: no chest pain, no fever, no headaches, no rash, no shortness of breath and no wheezing   ?Fever ?Associated symptoms: chills, congestion, cough, myalgias and sore throat   ?Associated symptoms: no chest pain, no dysuria, no headaches and no rash   ? ?Patient presents with 2 days of cough, nasal congestion, reported chills, fatigue.  States he woke yesterday with the symptoms.  No other known sick contacts.  He does have a sore throat as well and has been having some increased phlegm in the mornings that he is coughing up.  Otherwise denies any productive cough.  Denies any chest pain or shortness of breath.  He does feel some congestion in the upper chest and throat.  Denies any headache or ear pain.  He did take Tylenol 1 time yesterday with only mild improvement in symptoms.  Otherwise no other medication.  He does state he had a similar episode happen during the change of the season back in early fall.  Does report history of seasonal allergies.  ? ?Of note, he said he got 2 dots on his arm and back when he is taking ibuprofen once in the past but is taken it before with no other issues.  Denies any anaphylactic side effects from this. ? ?First day of illness:Yesterday ?Tmax taken: None at home - 98.6 F here ?Sick contacts: None ? ?Treatment:  ?Past Medical History:  ?Diagnosis Date  ? Tonsillitis   ? ? ?Patient Active Problem List  ? Diagnosis Date Noted  ? Vitamin D deficiency 08/05/2020  ? Ear pain, bilateral 08/05/2020  ? History of COVID-19 08/05/2020  ? Musculoskeletal chest pain 05/01/2020  ? Lower abdominal pain 01/19/2017  ?  Sore throat 01/26/2015  ? Refugee health examination 11/27/2014  ? Tobacco abuse 11/27/2014  ? Immigrant with language difficulty 11/27/2014  ? ? ?Past Surgical History:  ?Procedure Laterality Date  ? TONSILLECTOMY    ? ? ? ? ? ?Home Medications   ? ?Prior to Admission medications   ?Medication Sig Start Date End Date Taking? Authorizing Provider  ?benzonatate (TESSALON) 100 MG capsule Take 1 capsule (100 mg total) by mouth every 8 (eight) hours. 12/02/21  Yes Elba Barman, DO  ?cetirizine (ZYRTEC ALLERGY) 10 MG tablet Take 1 tablet (10 mg total) by mouth 2 (two) times daily for 10 days. 12/02/21 12/12/21 Yes Elba Barman, DO  ?acetaminophen (TYLENOL 8 HOUR) 650 MG CR tablet Take 2 tablets (1,300 mg total) by mouth in the morning and at bedtime. 05/14/21   Carollee Leitz, MD  ?diclofenac Sodium (VOLTAREN) 1 % GEL Apply 4 g topically 4 (four) times daily. 05/14/21   Carollee Leitz, MD  ?meloxicam (MOBIC) 15 MG tablet Take 1 tablet (15 mg total) by mouth daily. 05/14/21   Carollee Leitz, MD  ?diphenhydrAMINE (BENADRYL) 25 MG tablet Take 1 tablet (25 mg total) by mouth every 6 (six) hours as needed. 12/04/14 12/31/14  Melancon, York Ram, MD  ? ? ?Family History ?History reviewed. No pertinent family history. ? ?Social History ?Social History  ? ?Tobacco Use  ?  Smoking status: Every Day  ? Smokeless tobacco: Never  ?Vaping Use  ? Vaping Use: Never used  ?Substance Use Topics  ? Alcohol use: No  ?  Alcohol/week: 0.0 standard drinks  ? Drug use: No  ? ? ? ?Allergies   ?Cortizone-10 [hydrocortisone] and Ibuprofen ? ? ?Review of Systems ?Review of Systems  ?Constitutional:  Positive for chills and fatigue. Negative for fever.  ?HENT:  Positive for congestion, postnasal drip, sinus pressure and sore throat. Negative for trouble swallowing.   ?Respiratory:  Positive for cough. Negative for shortness of breath and wheezing.   ?Cardiovascular:  Negative for chest pain.  ?Gastrointestinal:  Negative for abdominal pain.  ?Genitourinary:   Negative for dysuria.  ?Musculoskeletal:  Positive for myalgias.  ?Skin:  Negative for color change and rash.  ?Neurological:  Negative for dizziness and headaches.  ? ? ?Physical Exam ?Triage Vital Signs ?ED Triage Vitals  ?Enc Vitals Group  ?   BP   ?   Pulse   ?   Resp   ?   Temp   ?   Temp src   ?   SpO2   ?   Weight   ?   Height   ?   Head Circumference   ?   Peak Flow   ?   Pain Score   ?   Pain Loc   ?   Pain Edu?   ?   Excl. in [place]?   ? ?No data found. ? ?Updated Vital Signs ?BP 98/63 (BP Location: Left Arm)   Pulse 98   Temp 98.6 ?F (37 ?C) (Oral)   Resp 17   SpO2 98%  ? ?Physical Exam ?Constitutional:   ?   Appearance: Normal appearance.  ?HENT:  ?   Head: Normocephalic and atraumatic.  ?   Right Ear: Tympanic membrane and ear canal normal.  ?   Left Ear: Tympanic membrane and ear canal normal.  ?   Nose: Congestion (clear, green) present.  ?   Mouth/Throat:  ?   Mouth: Mucous membranes are moist.  ?   Comments: + mild erythema ?+ posterior oropharynx cobblestoning ?Eyes:  ?   Extraocular Movements: Extraocular movements intact.  ?Cardiovascular:  ?   Rate and Rhythm: Normal rate.  ?   Heart sounds: No murmur heard. ?  No friction rub. No gallop.  ?Pulmonary:  ?   Effort: Pulmonary effort is normal.  ?   Breath sounds: Normal breath sounds. No wheezing, rhonchi or rales.  ?Abdominal:  ?   General: Abdomen is flat.  ?   Palpations: Abdomen is soft.  ?Musculoskeletal:  ?   Cervical back: Normal range of motion.  ?Lymphadenopathy:  ?   Cervical: No cervical adenopathy.  ?Skin: ?   General: Skin is warm.  ?   Capillary Refill: Capillary refill takes less than 2 seconds.  ?Neurological:  ?   General: No focal deficit present.  ?   Mental Status: He is alert. Mental status is at baseline.  ?Psychiatric:     ?   Mood and Affect: Mood normal.     ?   Thought Content: Thought content normal.  ? ? ? ?UC Treatments / Results  ?Labs ?(all labs ordered are listed, but only abnormal results are displayed) ?Labs  Reviewed - No data to display ? ?EKG ? ? ?Radiology ?No results found. ? ?Procedures ?Procedures (including critical care time) ? ?Medications Ordered in UC ?Medications  ?ketorolac (TORADOL) 30 MG/ML injection  30 mg (has no administration in time range)  ? ? ?Initial Impression / Assessment and Plan / UC Course  ?I have reviewed the triage vital signs and the nursing notes. ? ?Pertinent labs & imaging results that were available during my care of the patient were reviewed by me and considered in my medical decision making (see chart for details). ? ?  ? ?Patient with signs and symptoms of viral URI and postnasal drip on exam.  Normal vitals without fever or any use of antipyretics recently.  No known sick contacts.  Patient is asking for something acutely for his throat pain, will treat with Toradol IM 30 mg.  Discussed supportive treatment and usual 7-10-day course of improvement.  We will give him Tessalon Perles for cough and Zyrtec for allergy component.  Note provided for work.  Encouraged use of adequate sleep, increased hydration, honey for cough.  He is safe for discharge home.  Follow-up with PCP as needed. ?Final Clinical Impressions(s) / UC Diagnoses  ? ?Final diagnoses:  ?Viral URI with cough  ?Post-nasal drip  ?Seasonal allergies  ? ? ? ?Discharge Instructions   ? ?  ?WE will give you a shot of Toradol today, this should help your throat pain ? ?-Take Tessalon perles 1 capsule every 8 hours as needed for cough ?-I do think there may be an allergy component as well, this will help your nasal congestion to take cetirizine (Zyrtec) 1 tablet twice a day for 10 days ? ?-Would expect this to get better over the next 7-10 days, if it does not follow-up with PCP or here ? ? ? ? ?ED Prescriptions   ? ? Medication Sig Dispense Auth. Provider  ? benzonatate (TESSALON) 100 MG capsule Take 1 capsule (100 mg total) by mouth every 8 (eight) hours. 21 capsule Elba Barman, DO  ? cetirizine (ZYRTEC ALLERGY) 10 MG  tablet Take 1 tablet (10 mg total) by mouth 2 (two) times daily for 10 days. 20 tablet Elba Barman, DO  ? ?  ? ?PDMP not reviewed this encounter. ?  Elba Barman, DO ?12/02/21 1352 ? ?

## 2021-12-02 NOTE — ED Triage Notes (Signed)
Pt is present today with cough,  nasal congestion, fever,body aches and fatigue. Pt sx started yesterday   ?

## 2021-12-06 ENCOUNTER — Telehealth (HOSPITAL_COMMUNITY): Payer: Self-pay | Admitting: Emergency Medicine

## 2021-12-06 NOTE — Telephone Encounter (Signed)
Note created and available for pickup at front.

## 2021-12-06 NOTE — Telephone Encounter (Signed)
Spoke with patient explain per provider that we can extend note until 5/18 but we do not fill out FMLA paper work and if patient is still having sx he would need to return for revaluation. Pt understood and states that he will come pick the note up sometime today. Can you please provide this patient with a work note?

## 2022-03-25 ENCOUNTER — Ambulatory Visit: Payer: Medicaid Other | Admitting: Student

## 2022-03-25 NOTE — Progress Notes (Deleted)
  SUBJECTIVE:   CHIEF COMPLAINT / HPI:   Left ear plugged  PERTINENT  PMH / PSH: tobacco abuse  Past Medical History:  Diagnosis Date   Tonsillitis     Past Surgical History:  Procedure Laterality Date   TONSILLECTOMY      OBJECTIVE:  There were no vitals taken for this visit.  General: NAD, pleasant, able to participate in exam Cardiac: RRR, no murmurs auscultated. Respiratory: CTAB, normal effort, no wheezes, rales or rhonchi Abdomen: soft, non-tender, non-distended, normoactive bowel sounds Extremities: warm and well perfused, no edema or cyanosis. Skin: warm and dry, no rashes noted Neuro: alert, no obvious focal deficits, speech normal Psych: Normal affect and mood  ASSESSMENT/PLAN:  No problem-specific Assessment & Plan notes found for this encounter.   No orders of the defined types were placed in this encounter.  No orders of the defined types were placed in this encounter.  No follow-ups on file. @SIGNNOTE @ {    This will disappear when note is signed, click to select method of visit    :1}

## 2022-05-15 ENCOUNTER — Ambulatory Visit (INDEPENDENT_AMBULATORY_CARE_PROVIDER_SITE_OTHER): Payer: Medicaid Other | Admitting: Student

## 2022-05-15 ENCOUNTER — Encounter: Payer: Self-pay | Admitting: Student

## 2022-05-15 VITALS — BP 104/68 | HR 80 | Ht 69.0 in | Wt 166.2 lb

## 2022-05-15 DIAGNOSIS — G8929 Other chronic pain: Secondary | ICD-10-CM | POA: Diagnosis not present

## 2022-05-15 DIAGNOSIS — Z0000 Encounter for general adult medical examination without abnormal findings: Secondary | ICD-10-CM | POA: Diagnosis present

## 2022-05-15 DIAGNOSIS — M546 Pain in thoracic spine: Secondary | ICD-10-CM

## 2022-05-15 DIAGNOSIS — R0789 Other chest pain: Secondary | ICD-10-CM | POA: Diagnosis not present

## 2022-05-15 DIAGNOSIS — H9203 Otalgia, bilateral: Secondary | ICD-10-CM

## 2022-05-15 MED ORDER — NAPROXEN 500 MG PO TABS: 500.0000 mg | ORAL_TABLET | Freq: Two times a day (BID) | ORAL | 0 refills | Status: DC

## 2022-05-15 NOTE — Assessment & Plan Note (Signed)
Chronic left thoracic back pain most likely MSK related due to occupational requirements.  - ordered naproxen 500 mg BID PRN  - sent in referral to PT at patient request - he would like to make sure it can be covered by insurance

## 2022-05-15 NOTE — Assessment & Plan Note (Signed)
Clearing infection. Reassurance given. Return if having fevers or chills, increased ear pain, or ear discharge.

## 2022-05-15 NOTE — Progress Notes (Signed)
    SUBJECTIVE:   CHIEF COMPLAINT / HPI:   Jerome Parker is a 36 y.o. male presenting for annual physical and to discuss his ear pain and chronic thoracic back pain.   Ear pain:  Recently diagnosed with ear infection last week and was given Augmentin. He has completed the antibiotics and has felt more relief. He is still having a popping sensation in his ear and it still doesn't feel back to normal. He denies have fevers, chills, or URI symptoms.   Back pain:  Chronic from his job, he bends over with repetitive movements. He was given Voltaren gel in the past with good relief and has been doing at home PT exercises with good relief in symptoms. He would like something to help control the pain.   PERTINENT  PMH / PSH: tobacco abuse, recent ear infection (on last day of antibiotic)  OBJECTIVE:   BP 104/68   Pulse 80   Ht 5\' 9"  (1.753 m)   Wt 166 lb 3.2 oz (75.4 kg)   SpO2 98%   BMI 24.54 kg/m   Well-appearing, no acute distress HENT: Ears clear, canal with mild wax, TM on the right with positive light reflex and mild clear fluid consistent with resolving gear infection. Left ear normal with normal TM  Cardio: Regular rate, regular rhythm, no murmurs on exam. Pulm: Clear, no wheezing, no crackles. No increased work of breathing Abdominal: bowel sounds present, soft, non-tender, non-distended Extremities: no peripheral edema  MSK: Spurling test negative, no signs of shoulder impingement, no bony tenderness over the cervical or thoracic spine, no numbness or tingling, pain reproducible with palpation  ASSESSMENT/PLAN:   Healthcare maintenance Declined flu shot  - ordered CBC w diff, BMP, and lipid panel   Ear pain, bilateral Clearing infection. Reassurance given. Return if having fevers or chills, increased ear pain, or ear discharge.   Back pain Chronic left thoracic back pain most likely MSK related due to occupational requirements.  - ordered naproxen 500 mg BID PRN  - sent  in referral to PT at patient request - he would like to make sure it can be covered by insurance    Darci Current, [place]

## 2022-05-15 NOTE — Patient Instructions (Signed)
It was great to see you today! Thank you for choosing [facility] for your primary care.   Today we addressed: Your ear pain, this should continue to get better on its own. If you start having fevers again please call the office back and schedule an appointment.  I am ordering blood labs - I will either send you a letter in the mail or call you with your results.  I sent in an order for Naproxen for your back pain and also a referral to PT. You should receive a call from the office about this to schedule and what your insurance will cover.   If you haven't already, sign up for My Chart to have easy access to your labs results, and communication with your primary care physician.  We are checking some labs today. If they are abnormal, I will call you. If they are normal, I will send you a MyChart message (if it is active) or a letter in the mail. If you do not hear about your labs in the next 2 weeks, please call the office.   You should return to our clinic Return in about 6 months (around 11/14/2022).  I recommend that you always bring your medications to each appointment as this makes it easy to ensure you are on the correct medications and helps [place] not miss refills when you need them.  Please arrive 15 minutes before your appointment to ensure smooth check in process.  We appreciate your efforts in making this happen.  Please call the clinic at [phone] if your symptoms worsen or you have any concerns.  Thank you for allowing me to participate in your care, Dr. Sabra Heck

## 2022-05-15 NOTE — Assessment & Plan Note (Signed)
Declined flu shot  - ordered CBC w diff, BMP, and lipid panel

## 2022-05-22 ENCOUNTER — Other Ambulatory Visit: Payer: Medicaid Other

## 2022-07-01 ENCOUNTER — Encounter: Payer: Self-pay | Admitting: Family Medicine

## 2022-07-01 ENCOUNTER — Ambulatory Visit (INDEPENDENT_AMBULATORY_CARE_PROVIDER_SITE_OTHER): Payer: Medicaid Other | Admitting: Family Medicine

## 2022-07-01 VITALS — HR 83 | Wt 165.0 lb

## 2022-07-01 DIAGNOSIS — G8929 Other chronic pain: Secondary | ICD-10-CM | POA: Diagnosis not present

## 2022-07-01 DIAGNOSIS — M533 Sacrococcygeal disorders, not elsewhere classified: Secondary | ICD-10-CM | POA: Diagnosis not present

## 2022-07-01 DIAGNOSIS — M546 Pain in thoracic spine: Secondary | ICD-10-CM

## 2022-07-01 DIAGNOSIS — K644 Residual hemorrhoidal skin tags: Secondary | ICD-10-CM | POA: Diagnosis not present

## 2022-07-01 MED ORDER — KETOROLAC TROMETHAMINE 30 MG/ML IJ SOLN
30.0000 mg | Freq: Once | INTRAMUSCULAR | Status: AC
  Administered 2022-07-01: 30 mg via INTRAMUSCULAR

## 2022-07-01 MED ORDER — LIDOCAINE 5 % EX OINT: 1.0000 | TOPICAL_OINTMENT | CUTANEOUS | 0 refills | Status: DC | PRN

## 2022-07-01 MED ORDER — PHENYLEPHRINE-MINERAL OIL-PET 0.25-14-74.9 % RE OINT: 1.0000 | TOPICAL_OINTMENT | Freq: Two times a day (BID) | RECTAL | 10 refills | Status: DC | PRN

## 2022-07-01 MED ORDER — POLYETHYLENE GLYCOL 3350 17 GM/SCOOP PO POWD: 17.0000 g | Freq: Every day | ORAL | 1 refills | Status: AC

## 2022-07-01 MED ORDER — NAPROXEN 500 MG PO TABS: 500.0000 mg | ORAL_TABLET | Freq: Two times a day (BID) | ORAL | 2 refills | Status: DC

## 2022-07-01 NOTE — Patient Instructions (Signed)
I have translated the following text using Google translate.  As such, there are many errors.  I apologize for the poor written translation; however, we do not have written  translation services yet. It was wonderful to see you today. Thank you for allowing me to be a part of your care. Below is a short summary of what we discussed at your visit today:  Coccydynia Tail bone pain.  I believe this is the cause of your pain.  Today you got a toradol shot to relieve pain.  Use naproxen twice daily starting tomorrow.  Apply the lidocaine cream to the painful tailbone and buttocks areas.  Use a pillow to pad the area where you sit down.  Stay off work until you can walk better.   COME BACK TO CLINIC Thursday or Friday for re-examination.   Hemorrhoid I saw an external hemorrhoid.  Use preparation H cream on the outside, up to four times daily.  Use the miralax daily to help make your stools soft to prevent worsening.     Please bring all of your medications to every appointment!  If you have any questions or concerns, please do not hesitate to contact us via phone or MyChart message.   Fayette Pho, MD

## 2022-07-01 NOTE — Progress Notes (Unsigned)
    SUBJECTIVE:   CHIEF COMPLAINT / HPI:   Buttocks pain Mr. Connors presents with his wife for concern of acute onset severe buttocks pain.  Both the patient and his wife provide the history.  No interpreter needed at this time, both decline.  Patient reports the pain started Saturday, 4-day duration.  No preceding trauma or falls.  Reports pain of both buttocks and anus.  It is painful to sit down, to walk.  Also hurts when he coughs and when passing stool.  Does report some numbness and pins-and-needles pain of central posterior buttocks, also sciatic type pain that runs down his right leg.  Denies incontinence of bowel or bladder.  No blood in stool or urine. He does report chronic low back pain, but no known spinal or osseous abnormalities.  PERTINENT  PMH / PSH: Chronic low back pain, bilateral ear pain, COVID, vitamin D deficiency, tobacco use  OBJECTIVE:   Pulse 83   Wt 165 lb (74.8 kg)   SpO2 96%   BMI 24.37 kg/m    Physical exam performed with patient lying in right lateral decubitus position on exam table  General: Awake, alert, severe distress, curled up on exam table or lying flat, crying out in pain Skin: Skin overlying bilateral buttocks, low back, and superior posterior thighs appears unremarkable, no signs of ecchymosis or lesion, no swelling MSK: Moderate to severe TTP over bilateral buttocks, especially near gluteal cleft and with gluteal manipulation for anal exam GU: 1 large skin colored external hemorrhoid visualized at the 6 o'clock position, patient unable to tolerate gluteal manipulation well, declined to perform anoscopy at this time Neuro: Anal tone unable to be assessed given patient's pain with gluteal manipulation, but he has intact sensation near anus and over buttocks, was involuntarily moving away from examiner and clench buttocks  Sensitive exam performed with chaperone in room: Ruthy Dick, RN  ASSESSMENT/PLAN:   Coccydynia Acute, 4-day duration  with no preceding trauma.  Did find hemorrhoid on exam, but does not explain the buttocks pain and difficulty sitting or walking.  Believe this is coccydynia, possibly related to heavy lifting at work.  No imaging at this time. -S/p Toradol 30 mg IM in clinic - Naproxen 500 mg twice daily - Lidocaine 5% ointment over low back and buttocks - Pillow for sitting, coccydynia handout provided - Follow-up in 2 to 3 days, will need thorough neuro exam at that time for follow-up   External hemorrhoid Found on physical exam, but unlikely to be source of severe pain.  - Preparation H  - Lidocaine cream over buttocks and anus - MiraLAX daily     Fayette Pho, MD [facility]

## 2022-07-02 DIAGNOSIS — M533 Sacrococcygeal disorders, not elsewhere classified: Secondary | ICD-10-CM | POA: Insufficient documentation

## 2022-07-02 DIAGNOSIS — K644 Residual hemorrhoidal skin tags: Secondary | ICD-10-CM | POA: Insufficient documentation

## 2022-07-02 NOTE — Assessment & Plan Note (Signed)
Acute, 4-day duration with no preceding trauma.  Did find hemorrhoid on exam, but does not explain the buttocks pain and difficulty sitting or walking.  Believe this is coccydynia, possibly related to heavy lifting at work.  No imaging at this time. -S/p Toradol 30 mg IM in clinic - Naproxen 500 mg twice daily - Lidocaine 5% ointment over low back and buttocks - Pillow for sitting, coccydynia handout provided - Follow-up in 2 to 3 days, will need thorough neuro exam at that time for follow-up

## 2022-07-02 NOTE — Assessment & Plan Note (Signed)
Found on physical exam, but unlikely to be source of severe pain.  - Preparation H  - Lidocaine cream over buttocks and anus - MiraLAX daily

## 2022-07-04 ENCOUNTER — Ambulatory Visit: Payer: Medicaid Other | Admitting: Family Medicine

## 2022-07-04 ENCOUNTER — Encounter: Payer: Self-pay | Admitting: Family Medicine

## 2022-07-04 DIAGNOSIS — G8929 Other chronic pain: Secondary | ICD-10-CM

## 2022-07-04 DIAGNOSIS — M533 Sacrococcygeal disorders, not elsewhere classified: Secondary | ICD-10-CM | POA: Diagnosis not present

## 2022-07-04 DIAGNOSIS — K644 Residual hemorrhoidal skin tags: Secondary | ICD-10-CM

## 2022-07-04 DIAGNOSIS — M546 Pain in thoracic spine: Secondary | ICD-10-CM | POA: Diagnosis present

## 2022-07-04 MED ORDER — PHENYLEPHRINE-MINERAL OIL-PET 0.25-14-74.9 % RE OINT: 1.0000 | TOPICAL_OINTMENT | Freq: Two times a day (BID) | RECTAL | 10 refills | Status: AC | PRN

## 2022-07-04 MED ORDER — LIDOCAINE 5 % EX OINT: 1.0000 | TOPICAL_OINTMENT | CUTANEOUS | 0 refills | Status: DC | PRN

## 2022-07-04 MED ORDER — NAPROXEN 500 MG PO TABS: 500.0000 mg | ORAL_TABLET | Freq: Two times a day (BID) | ORAL | 0 refills | Status: DC

## 2022-07-04 NOTE — Assessment & Plan Note (Signed)
Marked interval improvement s/p Toradol IM in clinic and naproxen twice daily at home. - Continue naproxen 5 mg twice daily for total of 7 days - Refill Preparation H and lidocaine cream for as needed use - Provided sciatica and piriformis stretches handout - Refer to PT for sciatica and piriformis work

## 2022-07-04 NOTE — Patient Instructions (Signed)
It was wonderful to see you today. Thank you for allowing me to be a part of your care. Below is a short summary of what we discussed at your visit today:  Coccydynia and hemorrhoid I am so incredibly glad to see that you are doing so much better!  I have sent in refills for you of your naproxen, lidocaine 5% cream, and Preparation H.  I provided you a handout with stretches for sciatica and piriformis muscle.  Please do these at home while you are waiting to get into PT.  I am also sending you to physical therapy for sciatica and piriformis muscle stretches.  Someone from the physical therapy clinic should be calling you in 1 to 2 weeks up an appointment.  If you not hear from them in 2 weeks, please let us know.  Please bring all of your medications to every appointment!  If you have any questions or concerns, please do not hesitate to contact us via phone or MyChart message.   Fayette Pho, MD

## 2022-07-04 NOTE — Progress Notes (Signed)
    SUBJECTIVE:   CHIEF COMPLAINT / HPI:   Coccydynia follow-up Was seen by myself at [facility] 12/12 for excruciating buttocks and sacral pain.  I diagnosed him with coccydynia and external hemorrhoid.  Administered Toradol 30 mg IM in clinic.  Recommended naproxen 500 mg twice daily, lidocaine 5% ointment, and lidocaine cream.  Today, presents with his daughter and is found sitting in a chair when I enter the room.  This is drastic improvement from 12/12, where walking or sitting was very painful.  He reports drastic improvement of his symptoms.  His only lingering symptom is a little bit of electric sharp pain in the right buttocks.  PERTINENT  PMH / PSH:  Patient Active Problem List   Diagnosis Date Noted   Coccydynia 07/02/2022   External hemorrhoid 07/02/2022   Vitamin D deficiency 08/05/2020   Ear pain, bilateral 08/05/2020   History of COVID-19 08/05/2020   Back pain 05/01/2020   Lower abdominal pain 01/19/2017   Sore throat 01/26/2015   Healthcare maintenance 11/27/2014   Tobacco abuse 11/27/2014   Immigrant with language difficulty 11/27/2014    OBJECTIVE:   BP 124/70   Pulse 85   Ht 5\' 9"  (1.753 m)   Wt 169 lb (76.7 kg)   SpO2 99%   BMI 24.96 kg/m    General: Awake, alert, NAD, smiling Respiratory: Speaking clearly in full sentences, no respiratory distress MSK: Normal gait and range of motion of BUE and BLE  ASSESSMENT/PLAN:   Coccydynia Marked interval improvement s/p Toradol IM in clinic and naproxen twice daily at home. - Continue naproxen 5 mg twice daily for total of 7 days - Refill Preparation H and lidocaine cream for as needed use - Provided sciatica and piriformis stretches handout - Refer to PT for sciatica and piriformis work     , MD [facility]

## 2022-07-23 ENCOUNTER — Ambulatory Visit: Payer: Medicaid Other | Attending: Family Medicine

## 2022-07-23 NOTE — Therapy (Incomplete)
OUTPATIENT PHYSICAL THERAPY THORACOLUMBAR EVALUATION   Patient Name: Jerome Parker MRN: [id number] DOB:September 17, 1985, 37 y.o., male Today's Date: 07/23/2022  END OF SESSION:   Past Medical History:  Diagnosis Date   Tonsillitis    Past Surgical History:  Procedure Laterality Date   TONSILLECTOMY     Patient Active Problem List   Diagnosis Date Noted   Coccydynia 07/02/2022   External hemorrhoid 07/02/2022   Vitamin D deficiency 08/05/2020   Ear pain, bilateral 08/05/2020   History of COVID-19 08/05/2020   Back pain 05/01/2020   Lower abdominal pain 01/19/2017   Sore throat 01/26/2015   Healthcare maintenance 11/27/2014   Tobacco abuse 11/27/2014   Immigrant with language difficulty 11/27/2014    PCP: Wells Guiles, DO   REFERRING PROVIDER: McDiarmid, Blane Ohara, MD   REFERRING DIAG:  [phone] (ICD-10-CM) - Chronic right-sided thoracic back pain  M53.3 (ICD-10-CM) - Coccydynia    Rationale for Evaluation and Treatment: Rehabilitation  THERAPY DIAG:  No diagnosis found.  ONSET DATE: ***  SUBJECTIVE:                                                                                                                                                                                           SUBJECTIVE STATEMENT: ***  PERTINENT HISTORY:  ***  PAIN:  Are you having pain? Yes: NPRS scale: ***/10 Pain location: *** Pain description: *** Aggravating factors: *** Relieving factors: ***  PRECAUTIONS: {Therapy precautions:24002}  WEIGHT BEARING RESTRICTIONS: {Yes ***/No:24003}  FALLS:  Has patient fallen in last 6 months? {fallsyesno:27318}  LIVING ENVIRONMENT: Lives with: {[facility] lives with:25569::"lives with their family"} Lives in: {Lives in:25570} Stairs: {opstairs:27293} Has following equipment at home: {Assistive devices:23999}  OCCUPATION: ***  PLOF: {PLOF:24004}  PATIENT GOALS: ***  NEXT MD VISIT:   OBJECTIVE:   DIAGNOSTIC FINDINGS:   N/A  PATIENT SURVEYS:  {rehab surveys:24030}  SCREENING FOR RED FLAGS: Bowel or bladder incontinence: {Yes/No:304960894} Cauda equina syndrome: {Yes/No:304960894}  COGNITION: Overall cognitive status: {cognition:24006}     SENSATION: {sensation:27233}  MUSCLE LENGTH: Hamstrings: Right *** deg; Left *** deg Thomas test: Right *** deg; Left *** deg  POSTURE: {posture:25561}  PALPATION: ***  PASSIVE ACCESSORIES: ***  LUMBAR ROM:   AROM eval  Flexion   Extension   Right lateral flexion   Left lateral flexion   Right rotation   Left rotation    (Blank rows = not tested)  LOWER EXTREMITY MMT:    MMT Right eval Left eval  Hip flexion    Hip extension    Hip abduction    Hip adduction    Hip internal rotation    Hip  external rotation    Knee flexion    Knee extension    Ankle dorsiflexion    Ankle plantarflexion    Ankle inversion    Ankle eversion     (Blank rows = not tested)  LUMBAR SPECIAL TESTS:  {lumbar special test:25242}  FUNCTIONAL TESTS:  Squat: Forearm plank:  GAIT: Distance walked: *** Assistive device utilized: {Assistive devices:23999} Level of assistance: {Levels of assistance:24026} Comments: ***  TODAY'S TREATMENT:                                                                                                                               [facility] Adult PT Treatment:                                                DATE: 07/23/2022 Therapeutic Exercise: *** Manual Therapy: *** Neuromuscular re-ed: *** Therapeutic Activity: *** Modalities: *** Self Care: ***    PATIENT EDUCATION:  Education details: Pt educated on probable underlying pathophysiology, POC, prognosis, HEP, and ODI Person educated: Patient Education method: Explanation, Demonstration, and Handouts Education comprehension: verbalized understanding and returned demonstration  HOME EXERCISE PROGRAM: ***  ASSESSMENT:  CLINICAL IMPRESSION: Patient is a ***  y.o. *** who was seen today for physical therapy evaluation and treatment for ***.   OBJECTIVE IMPAIRMENTS: {opptimpairments:25111}.   ACTIVITY LIMITATIONS: {activitylimitations:27494}  PARTICIPATION LIMITATIONS: {participationrestrictions:25113}  PERSONAL FACTORS: {Personal factors:25162} are also affecting patient's functional outcome.   REHAB POTENTIAL: {rehabpotential:25112}  CLINICAL DECISION MAKING: {clinical decision making:25114}  EVALUATION COMPLEXITY: {Evaluation complexity:25115}   GOALS: Goals reviewed with patient? Yes  SHORT TERM GOALS: Target date: 08/20/2022  Pt will report understanding and adherence to initial HEP in order to promote independence in the management of primary impairments. Baseline: HEP provided at eval Goal status: INITIAL  2.  *** Baseline:  Goal status: {GOALSTATUS:25110}  3.  *** Baseline:  Goal status: {GOALSTATUS:25110}   LONG TERM GOALS: Target date: 09/17/2022  Pt will achieve an ODI score of *** in order to demonstrate improved functional ability as it relates to the pt's primary impairments. Baseline: *** Goal status: INITIAL  2.  *** Baseline:  Goal status: {GOALSTATUS:25110}  3.  *** Baseline:  Goal status: {GOALSTATUS:25110}  4.  *** Baseline:  Goal status: {GOALSTATUS:25110}  5.  *** Baseline:  Goal status: {GOALSTATUS:25110}  6.  *** Baseline:  Goal status: {GOALSTATUS:25110}  PLAN:  PT FREQUENCY: {rehab frequency:25116}  PT DURATION: {rehab duration:25117}  PLANNED INTERVENTIONS: {rehab planned interventions:25118::"Therapeutic exercises","Therapeutic activity","Neuromuscular re-education","Balance training","Gait training","Patient/Family education","Self Care","Joint mobilization"}.  PLAN FOR NEXT SESSION: Vanessa Wewoka, PT, DPT 07/23/22 11:41 AM

## 2022-08-05 ENCOUNTER — Encounter: Payer: Self-pay | Admitting: Student

## 2022-08-05 ENCOUNTER — Ambulatory Visit: Payer: Medicaid Other | Admitting: Student

## 2022-08-05 VITALS — BP 102/80 | HR 81 | Temp 98.2°F | Ht 68.0 in | Wt 173.6 lb

## 2022-08-05 DIAGNOSIS — U071 COVID-19: Secondary | ICD-10-CM | POA: Diagnosis present

## 2022-08-05 DIAGNOSIS — R051 Acute cough: Secondary | ICD-10-CM

## 2022-08-05 LAB — POC SOFIA 2 FLU + SARS ANTIGEN FIA
Influenza A, POC: NEGATIVE
Influenza B, POC: NEGATIVE
SARS Coronavirus 2 Ag: POSITIVE — AB

## 2022-08-05 MED ORDER — IPRATROPIUM BROMIDE 0.03 % NA SOLN: 2.0000 | Freq: Two times a day (BID) | NASAL | Status: AC

## 2022-08-05 NOTE — Progress Notes (Signed)
    SUBJECTIVE:   CHIEF COMPLAINT / HPI:   Viral URI sxs Feels ears clogged, took OTC cough and cold medicine. Coughing, rhinorrhea, and sinus pressure. Started yesterday.  No fevers, no chills, no bodyaches.  No sick contacts.  Viral URI, however patient like to be tested for flu.  Declines COVID testing.  PERTINENT  PMH / PSH:   OBJECTIVE:   BP 102/80   Pulse 81   Temp 98.2 F (36.8 C)   Ht 5\' 8"  (1.727 m)   Wt 173 lb 9.6 oz (78.7 kg)   SpO2 98%   BMI 26.40 kg/m    General: NAD, pleasant HEENT: Normocephalic, atraumatic head. Normal external ear, canal.  Fluid behind right TM, normal translucency and not bulging. EOM intact and normal conjunctiva BL. Normal external nose. Throat: Postnasal drip present, no exudate, no deviation. Normal dentition.  Cardio: RRR, no MRG. Cap Refill <2s. Respiratory: CTAB, normal wob on RA GI: Abdomen is soft, not tender, not distended. BS present Skin: Warm and dry  ASSESSMENT/PLAN:   COVID-19 Symptoms started yesterday, patient currently afebrile and benign physical exam.  Does have some fluid behind right TM, postnasal drip and rhinorrhea.  Patient requested to be swabbed for flu, however did not want to be swabbed for COVID. Patient left before labs resulted, and was prescribed Atrovent for suspected cough secondary to viral URI.  COVID and flu labs were obtained, which was an error-however COVID test was positive. Flu test negative. Called patient by phone to discuss results.  Apologized that COVID was obtained, despite his original request not to.  After conversation, patient was okay with this result and inquired about next steps. Patient otherwise healthy and low risk, do not recommend Paxlovid treatment at this time.  Recommend symptomatic care. Patient expressed understanding and agreement with plan. - Counseled on quarantine precautions (5-day at symptom onset), with 5 days of masking after. -Ipratropium nasal spray twice daily for 7  days, or until improved - OTC Tylenol or Profen for fever and pain  Leslie Dales, [place]

## 2022-08-05 NOTE — Assessment & Plan Note (Addendum)
Symptoms started yesterday, patient currently afebrile and benign physical exam.  Does have some fluid behind right TM, postnasal drip and rhinorrhea.  Patient requested to be swabbed for flu, however did not want to be swabbed for COVID. Patient left before labs resulted, and was prescribed Atrovent for suspected cough secondary to viral URI.  COVID and flu labs were obtained, which was an error-however COVID test was positive. Flu test negative. Called patient by phone to discuss results.  Apologized that COVID was obtained, despite his original request not to.  After conversation, patient was okay with this result and inquired about next steps. Patient otherwise healthy and low risk, do not recommend Paxlovid treatment at this time.  Recommend symptomatic care. Patient expressed understanding and agreement with plan. - Counseled on quarantine precautions (5-day at symptom onset), with 5 days of masking after. -Ipratropium nasal spray twice daily for 7 days, or until improved - OTC Tylenol or Profen for fever and pain

## 2022-08-05 NOTE — Patient Instructions (Signed)
It was great to see you! Thank you for allowing me to participate in your care!   I recommend that you always bring your medications to each appointment as this makes it easy to ensure we are on the correct medications and helps [place] not miss when refills are needed.  Our plans for today:  -I will call you with the results of the flu test -Please use 2 sprays ipratropium in each nostril 2 times daily for 7 days, or until better -Follow-up with Dr. Jeani Hawking to discuss flank pain  Take care and seek immediate care sooner if you develop any concerns. Please remember to show up 15 minutes before your scheduled appointment time!  Leslie Dales, DO [facility]

## 2022-08-15 ENCOUNTER — Ambulatory Visit (INDEPENDENT_AMBULATORY_CARE_PROVIDER_SITE_OTHER): Payer: Medicaid Other | Admitting: Family Medicine

## 2022-08-15 DIAGNOSIS — R0781 Pleurodynia: Secondary | ICD-10-CM

## 2022-08-15 DIAGNOSIS — M533 Sacrococcygeal disorders, not elsewhere classified: Secondary | ICD-10-CM | POA: Diagnosis not present

## 2022-08-15 MED ORDER — KETOROLAC TROMETHAMINE 30 MG/ML IJ SOLN
30.0000 mg | Freq: Once | INTRAMUSCULAR | Status: AC
  Administered 2022-08-15: 30 mg via INTRAMUSCULAR

## 2022-08-15 NOTE — Patient Instructions (Signed)
It was wonderful to see you today. Thank you for allowing me to be a part of your care. Below is a short summary of what we discussed at your visit today:  Rib pain and coccydynia Toradol shot today in clinic. Start Aleve 500 mg twice daily tomorrow.  Take this for 3 days in a row.  This should help reduce the inflammation. I have ordered an x-ray of your ribs on the left.  Please go to [facility] imaging on [address], details below.  [facility] [address] [place], [place], [place] [address] [phone] Mon-Fri 8-5       If you have any questions or concerns, please do not hesitate to contact us via phone or MyChart message.   Ezequiel Essex, MD

## 2022-08-15 NOTE — Progress Notes (Unsigned)
    SUBJECTIVE:   CHIEF COMPLAINT / HPI:   COVID illness follow up No longer fevering Now has left side pain, right buttock coccyx pain Problems sleeping   PERTINENT  PMH / PSH: ***  OBJECTIVE:   There were no vitals taken for this visit.  ***  ASSESSMENT/PLAN:   No problem-specific Assessment & Plan notes found for this encounter.     Ezequiel Essex, MD [place]

## 2022-08-18 DIAGNOSIS — R0781 Pleurodynia: Secondary | ICD-10-CM | POA: Insufficient documentation

## 2022-08-18 NOTE — Assessment & Plan Note (Signed)
Flared again with COVID infection, possibly due to decreased mobility while recovering. IM toradol here in clinic. Recommend Aleve 500 mg BID x 3-5 days starting tomorrow. Continue home PT for coccydynia. Return precautions given, see AVS for more.

## 2022-08-18 NOTE — Assessment & Plan Note (Signed)
Likely MSK secondary given recent COVID infection and significant coughing. Patient requests rib XR rule out fracture. While unlikely, will order. Patient can complete at his convenience. Recommend lidocaine patch over most tender site.

## 2022-08-22 ENCOUNTER — Ambulatory Visit
Admission: RE | Admit: 2022-08-22 | Discharge: 2022-08-22 | Disposition: A | Discharge status: Home / Self Care | Payer: Medicaid Other | Source: Ambulatory Visit | Attending: Family Medicine | Admitting: Family Medicine

## 2022-08-22 ENCOUNTER — Other Ambulatory Visit: Payer: Self-pay | Admitting: Family Medicine

## 2022-08-22 DIAGNOSIS — R0781 Pleurodynia: Secondary | ICD-10-CM

## 2022-08-22 DIAGNOSIS — M533 Sacrococcygeal disorders, not elsewhere classified: Secondary | ICD-10-CM

## 2022-08-24 ENCOUNTER — Encounter (HOSPITAL_COMMUNITY): Payer: Self-pay | Admitting: Family Medicine

## 2022-10-13 ENCOUNTER — Ambulatory Visit: Payer: Medicaid Other | Admitting: Family Medicine

## 2022-10-13 VITALS — BP 116/75 | HR 82 | Ht 68.0 in | Wt 168.0 lb

## 2022-10-13 DIAGNOSIS — M25562 Pain in left knee: Secondary | ICD-10-CM | POA: Diagnosis not present

## 2022-10-13 DIAGNOSIS — M25561 Pain in right knee: Secondary | ICD-10-CM | POA: Diagnosis not present

## 2022-10-13 DIAGNOSIS — M7918 Myalgia, other site: Secondary | ICD-10-CM

## 2022-10-13 DIAGNOSIS — G8929 Other chronic pain: Secondary | ICD-10-CM | POA: Diagnosis not present

## 2022-10-13 MED ORDER — KETOROLAC TROMETHAMINE 30 MG/ML IJ SOLN
30.0000 mg | Freq: Once | INTRAMUSCULAR | Status: AC
  Administered 2022-10-13: 30 mg via INTRAMUSCULAR

## 2022-10-13 NOTE — Progress Notes (Signed)
  SUBJECTIVE:   CHIEF COMPLAINT / HPI:   Buttock and knee pain, ongoing for several months but worse over the past couple of days -Pain is worse in right buttock but also present in left.  Pain is in the muscle, not the bone. 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  No bowel/bladder changes, no saddle anesthesia -Aleve helps a lot -Received IM Toradol at last visit which helped him   PERTINENT  PMH / PSH: coccydynia    OBJECTIVE:  BP 116/75   Pulse 82   Ht 5\' 8"  (1.727 m)   Wt 168 lb (76.2 kg)   SpO2 100%   BMI 25.54 kg/m   General: NAD, pleasant, able to participate in exam Cardiac: RRR, no murmurs auscultated Respiratory: CTAB, normal WOB Abdomen: soft, non-tender, non-distended, normoactive bowel sounds MSK: Negative straight leg test bilaterally.  Nontender to palpation along iliac crest bilaterally.  Nontender to palpation along lumbar spinous processes. Patient does have mild tenderness to palpation of bilateral gluteal muscles, worse on right. Patient does have mild tenderness to palpation of right knee along the joint line Normal ROM of bilateral hips and knees, passive and active, slightly/intermittently limited by pain  ASSESSMENT/PLAN:   1. Buttock pain, knee pain Feel that his symptoms are likely related to his posture especially at work, as he is often on his knees which exacerbates his pain.  His exam is rather unrevealing but may point towards an inflammatory process such as tendinitis or arthritis.  Myositis is also a consideration given his tenderness on exam.  His symptoms do great we improve with NSAIDs, so will advise that the patient continue using naproxen OTC especially since he is going to be taking a long flight this week.  Will also give 1  dose of IM Toradol.  Given lack of radicular pain, low suspicion for neuropathic etiology.  Advised to follow-up after his trip to [place], at which point can consider additional workup and/or imaging  - ketorolac (TORADOL) 30 MG/ML injection 30 mg  Meds ordered this encounter  Medications   ketorolac (TORADOL) 30 MG/ML injection 30 mg   Return in about 1 month (around 11/13/2022).  August Albino, MD [facility]

## 2022-10-13 NOTE — Patient Instructions (Signed)
It was wonderful to see you today.  Please bring ALL of your medications with you to every visit.   Updates from today's visit:  Please continue to take Aleve (naproxen) for your pain  Please follow-up after your trip to [place], at that time we may consider getting additional imaging  Please follow up in 1 month  Thank you for choosing [facility].   Please call [phone] with any questions about today's appointment.  Please be sure to schedule follow up at the front  desk before you leave today.   August Albino, MD  Family Medicine

## 2023-01-23 ENCOUNTER — Ambulatory Visit: Payer: Medicaid Other | Admitting: Student

## 2023-01-23 VITALS — BP 120/80 | HR 140 | Temp 100.5°F | Ht 68.0 in | Wt 170.0 lb

## 2023-01-23 DIAGNOSIS — R509 Fever, unspecified: Secondary | ICD-10-CM

## 2023-01-23 LAB — GLUCOSE, POCT (MANUAL RESULT ENTRY): POC Glucose: 118 mg/dl — AB (ref 70–99)

## 2023-01-23 MED ORDER — ONDANSETRON HCL 4 MG PO TABS: 4.0000 mg | ORAL_TABLET | Freq: Three times a day (TID) | ORAL | 0 refills | Status: AC | PRN

## 2023-01-23 NOTE — Assessment & Plan Note (Signed)
I do suspect viral illness although this is clouded by his response.  It is helpful to know that he generally moans and groans when presenting with a virus as endorsed by wife at home.  His fever is improving and I suspect tachycardia is related to fever as he has not had any medication since last night.  Physical exam does not suggest acute illness that would require ED evaluation.  Generalized body aches with fever, sore throat and dry cough mostly suspect viral bronchitis.  Will check urine and given he presented with nausea towards the end of encounter, check CBG for hypoglycemia which was hyperglycemic at 118.  I have advised Tylenol ibuprofen, hydration, Zofran for nausea, Robitussin/Delsym for cough.  Work note provided.  ED and return precautions discussed.

## 2023-01-23 NOTE — Patient Instructions (Signed)
It was great to see you today! Thank you for choosing [facility] for your primary care. Jerome Parker was seen for fever.  Today we addressed: I suspect you have a virus.  Tylenol 1000 mg every 6 hours and ibuprofen 600 mg every 8 hours as needed.  The fever should respond to these medications.  You need to focus on hydration and getting sleep.  I am also prescribing you Zofran for any nausea you may be experiencing.  If this does not get better within the next couple days please return here or to the ED.  If you haven't already, sign up for My Chart to have easy access to your labs results, and communication with your primary care physician.  Call the clinic at [phone] if your symptoms worsen or you have any concerns.  Please arrive 15 minutes before your appointment to ensure smooth check in process.  We appreciate your efforts in making this happen.  Thank you for allowing me to participate in your care, Shelby Mattocks, DO 01/23/2023, 12:07 PM PGY-3, [facility]

## 2023-01-23 NOTE — Progress Notes (Signed)
SUBJECTIVE:   CHIEF COMPLAINT / HPI:   Fever, cough, chest pain, back pain, pain everywhere x 3 days. Fever was the first symptom with Tmax 102-103. Iburpfoen (600mg ) and tylenol (1000mg ) helped with the fever but then it comes back in 3 days. He has never felt this ill before. His 2 children were sick 20 days ago. Denies nausea, vomiting, abdominal pain.  Notes his urine has been a little bit more yellow than it usually is.  He is not sleeping as well but his appetite remains unchanged.  Presents with his wife who notes that he is always like this when he gets sick.  They tried COVID testing at home but did not get a good sample.  He is requesting a pain injection today.  PERTINENT  PMH / PSH: N/A  Patient Care Team: Shelby Mattocks, DO as PCP - General (Family Medicine) OBJECTIVE:  BP 120/80   Pulse (!) 140   Temp (!) 100.5 F (38.1 C) (Oral)   Ht 5\' 8"  (1.727 m)   Wt 170 lb (77.1 kg)   SpO2 95%   BMI 25.85 kg/m  Physical Exam Constitutional:      Appearance: Normal appearance.  HENT:     Right Ear: Tympanic membrane normal.     Left Ear: Tympanic membrane normal.     Mouth/Throat:     Mouth: Mucous membranes are moist.     Pharynx: Oropharynx is clear. Posterior oropharyngeal erythema present.  Eyes:     Extraocular Movements: Extraocular movements intact.     Pupils: Pupils are equal, round, and reactive to light.  Cardiovascular:     Rate and Rhythm: Regular rhythm. Tachycardia present.  Pulmonary:     Breath sounds: Normal breath sounds.  Abdominal:     General: Abdomen is flat. Bowel sounds are normal.     Palpations: Abdomen is soft.  Musculoskeletal:     Cervical back: Normal range of motion.  Skin:    General: Skin is warm and dry.  Neurological:     General: No focal deficit present.     Mental Status: He is alert and oriented to person, place, and time.     Motor: No weakness.  Psychiatric:        Mood and Affect: Mood normal.        Behavior: Behavior  normal.    ASSESSMENT/PLAN:  Fever, unspecified fever cause Assessment & Plan: I do suspect viral illness although this is clouded by his response.  It is helpful to know that he generally moans and groans when presenting with a virus as endorsed by wife at home.  His fever is improving and I suspect tachycardia is related to fever as he has not had any medication since last night.  Physical exam does not suggest acute illness that would require ED evaluation.  Generalized body aches with fever, sore throat and dry cough mostly suspect viral bronchitis.  Will check urine and given he presented with nausea towards the end of encounter, check CBG for hypoglycemia which was hyperglycemic at 118.  I have advised Tylenol ibuprofen, hydration, Zofran for nausea, Robitussin/Delsym for cough.  Work note provided.  ED and return precautions discussed.  Orders: -     POCT glucose (manual entry) -     Urinalysis -     Ondansetron HCl; Take 1 tablet (4 mg total) by mouth every 8 (eight) hours as needed for nausea or vomiting.  Dispense: 20 tablet; Refill: 0   No follow-ups on  file. Shelby Mattocks, DO 01/23/2023, 12:27 PM PGY-3, [facility]

## 2023-01-24 LAB — URINALYSIS
Bilirubin, UA: NEGATIVE
Glucose, UA: NEGATIVE
Ketones, UA: NEGATIVE
Leukocytes,UA: NEGATIVE
Nitrite, UA: NEGATIVE
Protein,UA: NEGATIVE
RBC, UA: NEGATIVE
Specific Gravity, UA: 1.024 (ref 1.005–1.030)
Urobilinogen, Ur: 0.2 mg/dL (ref 0.2–1.0)
pH, UA: 5.5 (ref 5.0–7.5)

## 2023-01-26 ENCOUNTER — Encounter: Payer: Self-pay | Admitting: Student

## 2023-02-15 IMAGING — CR DG CHEST 2V
2 series · 2 of 2 positions shown · non-contrast
Comparison: February 13, 2020

CLINICAL DATA: Left axillary pain x3 weeks.

EXAM:
CHEST - 2 VIEW

[w chest pa]
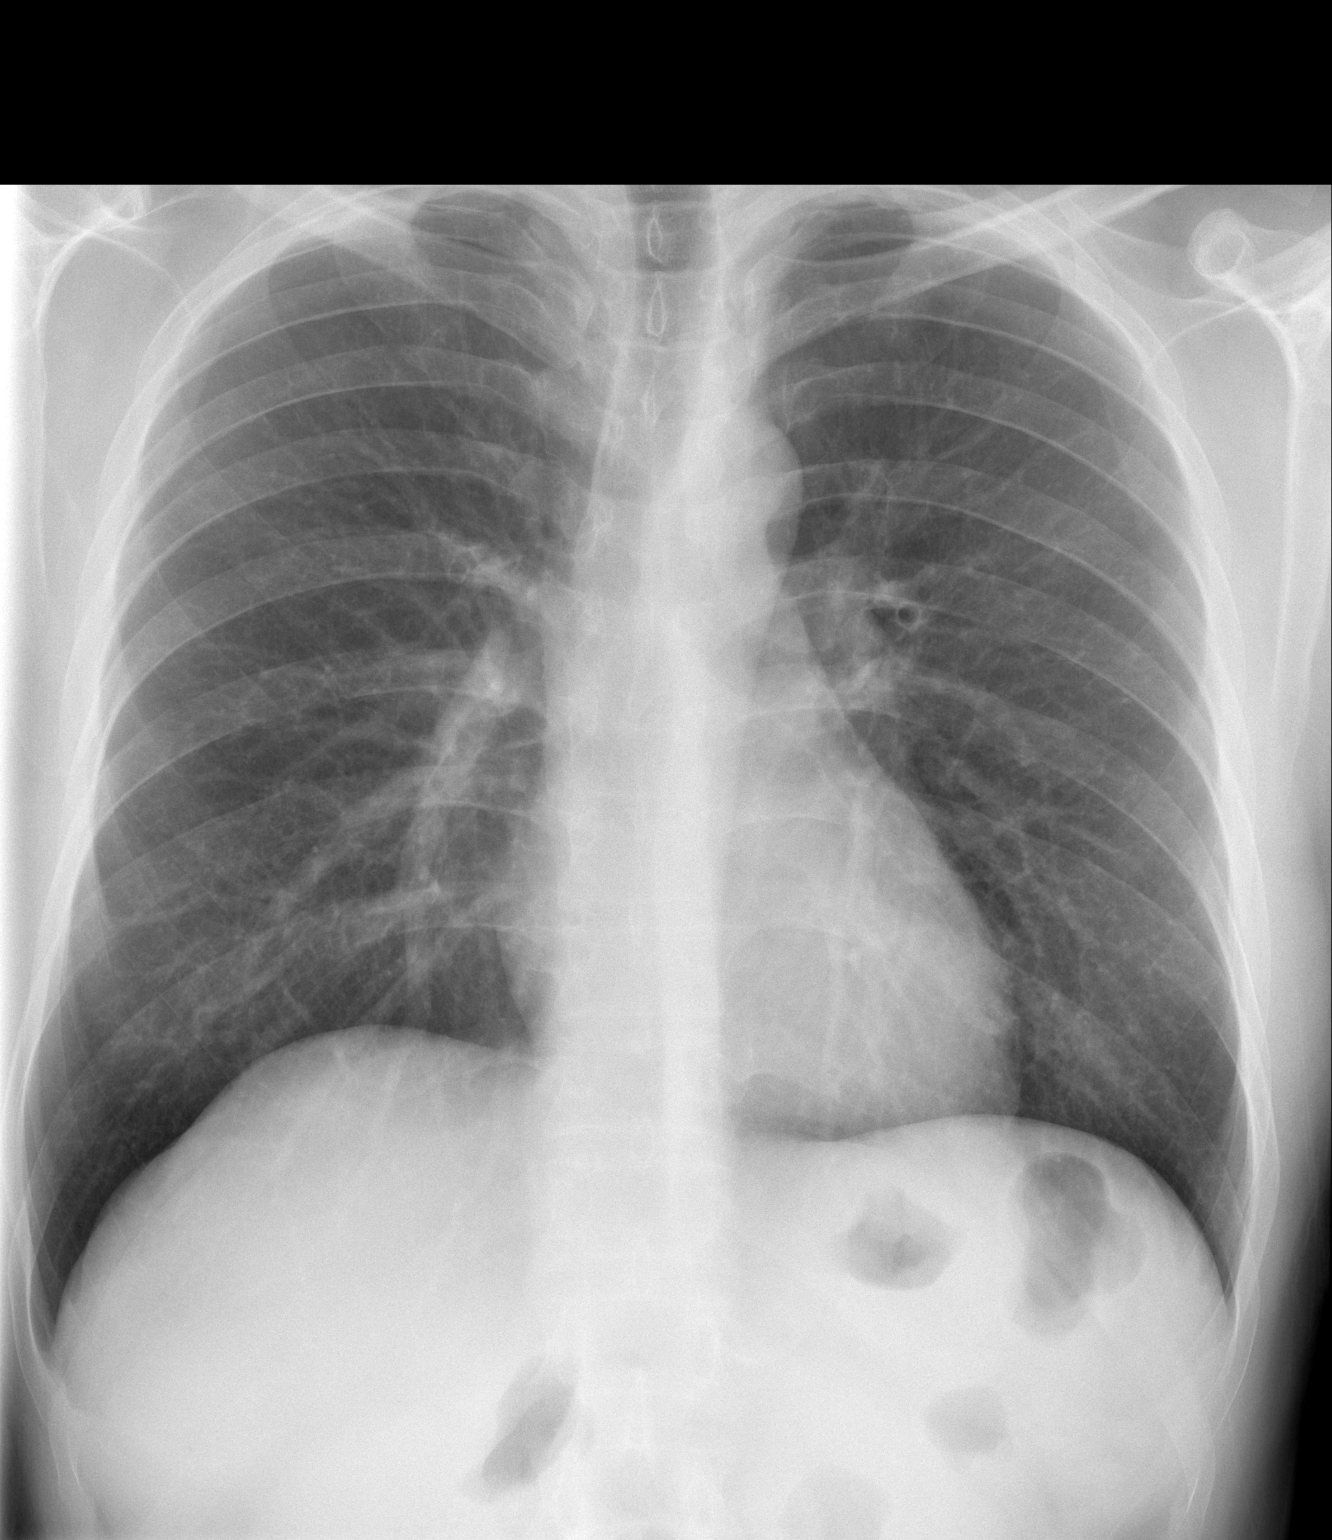

[w chest lat]
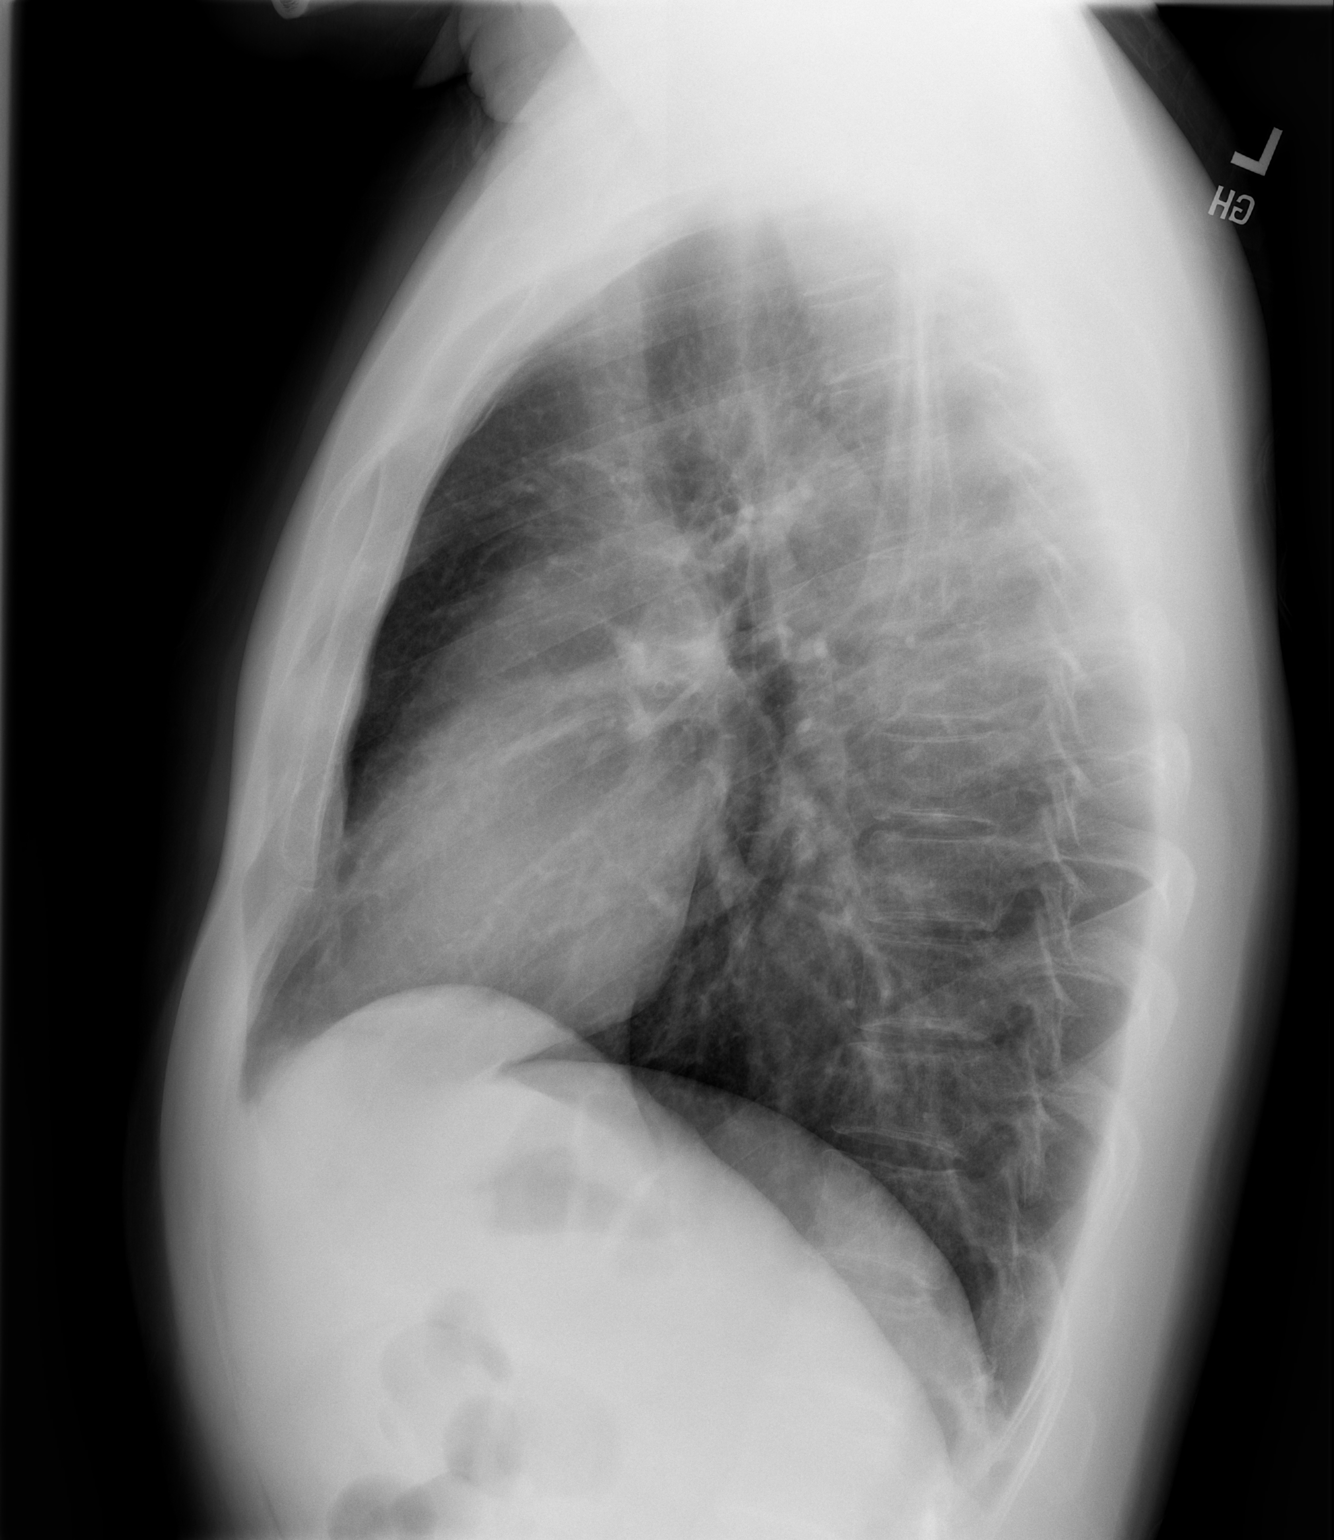

[2 of 2 positions shown; findings below may reference images not displayed]

FINDINGS: The heart size and mediastinal contours are within normal limits.
Both lungs are clear. The visualized skeletal structures are
unremarkable.
IMPRESSION: No active cardiopulmonary disease.

## 2023-03-02 NOTE — Progress Notes (Deleted)
  SUBJECTIVE:   CHIEF COMPLAINT / HPI:   Presenting today for sciatica(?) follow up. Of note, he was seen on 10/13/22 by buttock pain.  PERTINENT  PMH / PSH: Coccydynia  Patient Care Team: Shelby Mattocks, DO as PCP - General (Family Medicine) OBJECTIVE:  There were no vitals taken for this visit. Physical Exam   ASSESSMENT/PLAN:  There are no diagnoses linked to this encounter. No follow-ups on file. Shelby Mattocks, DO 03/02/2023, 8:54 PM PGY-***, [facility] {    This will disappear when note is signed, click to select method of visit    :1}

## 2023-03-03 ENCOUNTER — Ambulatory Visit: Payer: Medicaid Other | Admitting: Student

## 2023-03-03 ENCOUNTER — Ambulatory Visit: Payer: Medicaid Other

## 2023-03-03 ENCOUNTER — Ambulatory Visit
Admission: RE | Admit: 2023-03-03 | Discharge: 2023-03-03 | Disposition: A | Discharge status: Home / Self Care | Payer: Medicaid Other | Source: Ambulatory Visit | Attending: Family Medicine | Admitting: Family Medicine

## 2023-03-03 VITALS — BP 99/72 | HR 77 | Ht 68.0 in | Wt 175.0 lb

## 2023-03-03 DIAGNOSIS — M5430 Sciatica, unspecified side: Secondary | ICD-10-CM

## 2023-03-03 DIAGNOSIS — H6992 Unspecified Eustachian tube disorder, left ear: Secondary | ICD-10-CM | POA: Diagnosis not present

## 2023-03-03 DIAGNOSIS — M7918 Myalgia, other site: Secondary | ICD-10-CM | POA: Diagnosis present

## 2023-03-03 MED ORDER — KETOROLAC TROMETHAMINE 30 MG/ML IJ SOLN
15.0000 mg | Freq: Once | INTRAMUSCULAR | Status: AC
  Administered 2023-03-03: 15 mg via INTRAMUSCULAR

## 2023-03-03 NOTE — Patient Instructions (Addendum)
It was great to see you today! Thank you for choosing [facility] for your primary care.  Today we addressed: We will get an xray at [facility] [address] Giving toradol injection Take 650-100 mg of Tylenol every 6 hours as needed for pain relief or fever.  Avoid taking more than 3000 mg in a 24-hour period (This may cause liver damage). I have referred you ENT as well   If you haven't already, sign up for My Chart to have easy access to your labs results, and communication with your primary care physician. I recommend that you always bring your medications to each appointment as this makes it easy to ensure you are on the correct medications and helps [place] not miss refills when you need them. Call the clinic at [phone] if your symptoms worsen or you have any concerns. No follow-ups on file. Please arrive 15 minutes before your appointment to ensure smooth check in process.  We appreciate your efforts in making this happen.  Thank you for allowing me to participate in your care, Jerome Martinez, MD 03/03/2023, 4:15 PM PGY-3, [facility]

## 2023-03-03 NOTE — Progress Notes (Unsigned)
  SUBJECTIVE:   CHIEF COMPLAINT / HPI:   Presenting today for sciatica(?) follow up. Of note, he was seen on 10/13/22 by buttock pain.  -XR lumbar spine  -Naproxen has helped in the past    PERTINENT  PMH / PSH: Coccydynia  Patient Care Team: Shelby Mattocks, DO as PCP - General (Family Medicine) OBJECTIVE:  There were no vitals taken for this visit. Physical Exam   ASSESSMENT/PLAN:  There are no diagnoses linked to this encounter. No follow-ups on file. Jerome Martinez, MD 03/03/2023, 3:49 PM PGY-***, [facility] {    This will disappear when note is signed, click to select method of visit    :1}

## 2023-03-04 DIAGNOSIS — M7918 Myalgia, other site: Secondary | ICD-10-CM | POA: Insufficient documentation

## 2023-03-04 DIAGNOSIS — H6992 Unspecified Eustachian tube disorder, left ear: Secondary | ICD-10-CM | POA: Insufficient documentation

## 2023-03-04 NOTE — Assessment & Plan Note (Signed)
Discussed that he likely needed an x-ray of the lumbar spine prior to referral to any specialist.  He has no red flag symptoms.  No evidence of cauda equina or nerve impingement.  Declines physical therapy.  Will provide him with a Toradol injection (kidney function normal).  See PCP for follow up.

## 2023-03-04 NOTE — Assessment & Plan Note (Signed)
Present for an extended period of time and seems to be slightly affecting his hearing, will refer him to ENT for possible imaging.

## 2023-03-05 ENCOUNTER — Encounter: Payer: Self-pay | Admitting: Student

## 2023-04-29 ENCOUNTER — Encounter: Payer: Self-pay | Admitting: Student

## 2023-04-29 ENCOUNTER — Ambulatory Visit: Payer: Medicaid Other | Admitting: Student

## 2023-04-29 VITALS — BP 104/74 | HR 61 | Ht 68.0 in | Wt 178.2 lb

## 2023-04-29 DIAGNOSIS — M542 Cervicalgia: Secondary | ICD-10-CM

## 2023-04-29 MED ORDER — LIDOCAINE 5 % EX PTCH: 1.0000 | MEDICATED_PATCH | CUTANEOUS | 0 refills | Status: AC

## 2023-04-29 MED ORDER — DICLOFENAC SODIUM 1 % EX GEL: 4.0000 g | Freq: Four times a day (QID) | CUTANEOUS | 0 refills | Status: AC

## 2023-04-29 MED ORDER — NAPROXEN 500 MG PO TABS: 500.0000 mg | ORAL_TABLET | Freq: Two times a day (BID) | ORAL | 0 refills | Status: AC

## 2023-04-29 NOTE — Progress Notes (Signed)
    SUBJECTIVE:   CHIEF COMPLAINT / HPI:   Neck Pain:  -reports of several days of pain in the front of the neck on left side -Relates this to straining his neck at work frequently -Also recently had family day at work and was around many people -No fever or systemic symptoms  -No known sick contacts  -No congestion -No swelling in the neck -Pain with lateral movement of the neck    PERTINENT  PMH / PSH: Reviewed   OBJECTIVE:   BP 104/74   Pulse 61   Ht 5\' 8"  (1.727 m)   Wt 178 lb 3.2 oz (80.8 kg)   SpO2 100%   BMI 27.10 kg/m   General: Alert and oriented in no apparent distress Head: [place]/AT.   Eyes:  EOMI Ears:  External ears WNL Nose:  Septum midline  Mouth:  MMM Neck: No LAD, pain with lateral movement left and right, not with flexion or extension. Pain located front left aspect of the neck, no palpable nodules over thyroid, no trismus Heart: Regular rate and rhythm with no murmurs appreciated Lungs: CTA bilaterally, no wheezing Abdomen: Bowel sounds present, no abdominal pain Skin: Warm and dry    ASSESSMENT/PLAN:   Assessment & Plan Neck pain Seems MSK in nature, no red flags or systemic symptoms, no palpable lymph nodes. Conservative treatment with lidocaine, naproxen limited to five days with tylenol and lidocaine patches or diclofenac gel (topical treatments not together at same time). PT ordered on patient request. Return to PCP if symptoms return.      Alfredo Martinez, MD [facility]

## 2023-04-29 NOTE — Patient Instructions (Addendum)
It was great to see you today! Thank you for choosing [facility] for your primary care.  Today we addressed: Use Diclofenac gel or Lidocaine patches  Naproxen twice a day for 5 days with food  Take tylenol and PT ordered as well  Return if symptoms persist   If you haven't already, sign up for My Chart to have easy access to your labs results, and communication with your primary care physician. I recommend that you always bring your medications to each appointment as this makes it easy to ensure you are on the correct medications and helps [place] not miss refills when you need them. Call the clinic at [phone] if your symptoms worsen or you have any concerns. Return if symptoms worsen or fail to improve. Please arrive 15 minutes before your appointment to ensure smooth check in process.  We appreciate your efforts in making this happen.  Thank you for allowing me to participate in your care, Jerome Martinez, MD 04/29/2023, 11:17 AM PGY-3, [facility]

## 2023-04-30 ENCOUNTER — Encounter: Payer: Self-pay | Admitting: Student

## 2023-05-14 ENCOUNTER — Telehealth: Payer: Self-pay

## 2023-05-14 NOTE — Telephone Encounter (Signed)
Pharmacy Patient Advocate Encounter   Received notification from CoverMyMeds that prior authorization for LIDOCAINE 5% PATCHES is required/requested.   PA required; PA submitted to [facility] via CoverMyMeds Key/confirmation #/[id number] QQ. Status is pending

## 2023-05-18 ENCOUNTER — Encounter: Payer: Self-pay | Admitting: Student

## 2023-05-18 NOTE — Telephone Encounter (Signed)
Pharmacy Patient Advocate Encounter  Received notification from [facility] that Prior Authorization for LIDOCAINE 5% PATCHES has been DENIED.  Full denial letter will be uploaded to the media tab. See denial reason below.     Lidocaine 4% patches available OTC  PA #/Case ID/Reference #: [id number]

## 2023-05-21 ENCOUNTER — Ambulatory Visit: Payer: Medicaid Other | Attending: Family Medicine

## 2023-06-03 ENCOUNTER — Encounter: Payer: Self-pay | Admitting: Student
